# Patient Record
Sex: Female | Born: 1960 | Race: Black or African American
Health system: Southern US, Community
[De-identification: ages and names within clinical notes are randomized; demographics above are authoritative.]

## PROBLEM LIST (undated history)

## (undated) DIAGNOSIS — M1990 Unspecified osteoarthritis, unspecified site: Secondary | ICD-10-CM

## (undated) DIAGNOSIS — K219 Gastro-esophageal reflux disease without esophagitis: Secondary | ICD-10-CM

## (undated) HISTORY — PX: FEMUR SURGERY: SHX943

## (undated) HISTORY — DX: Unspecified osteoarthritis, unspecified site: M19.90

## (undated) HISTORY — DX: Gastro-esophageal reflux disease without esophagitis: K21.9

---

## 1984-11-14 HISTORY — PX: TUBAL LIGATION: SHX77

## 2005-06-01 ENCOUNTER — Ambulatory Visit: Payer: Self-pay | Admitting: Family Medicine

## 2006-06-06 ENCOUNTER — Emergency Department: Payer: Self-pay | Admitting: Emergency Medicine

## 2006-08-17 ENCOUNTER — Ambulatory Visit: Payer: Self-pay | Admitting: Family Medicine

## 2007-01-17 ENCOUNTER — Other Ambulatory Visit: Payer: Self-pay

## 2007-01-17 ENCOUNTER — Inpatient Hospital Stay: Payer: Self-pay | Admitting: *Deleted

## 2007-05-16 ENCOUNTER — Ambulatory Visit: Payer: Self-pay | Admitting: Family Medicine

## 2007-10-05 ENCOUNTER — Ambulatory Visit: Payer: Self-pay | Admitting: Family Medicine

## 2007-10-17 ENCOUNTER — Ambulatory Visit: Payer: Self-pay | Admitting: Family Medicine

## 2008-06-12 ENCOUNTER — Ambulatory Visit: Payer: Self-pay | Admitting: Family Medicine

## 2009-03-05 ENCOUNTER — Ambulatory Visit: Payer: Self-pay | Admitting: Emergency Medicine

## 2009-06-17 ENCOUNTER — Ambulatory Visit: Payer: Self-pay | Admitting: Family Medicine

## 2009-09-16 ENCOUNTER — Emergency Department: Payer: Self-pay | Admitting: Emergency Medicine

## 2010-05-20 ENCOUNTER — Ambulatory Visit: Payer: Self-pay | Admitting: General Practice

## 2010-06-22 ENCOUNTER — Ambulatory Visit: Payer: Self-pay | Admitting: Family

## 2010-07-05 ENCOUNTER — Emergency Department: Payer: Self-pay | Admitting: Emergency Medicine

## 2011-06-28 ENCOUNTER — Ambulatory Visit: Payer: Self-pay | Admitting: Family Medicine

## 2012-01-29 ENCOUNTER — Emergency Department: Payer: Self-pay | Admitting: Emergency Medicine

## 2012-05-05 ENCOUNTER — Ambulatory Visit: Payer: Self-pay | Admitting: Rheumatology

## 2012-07-03 ENCOUNTER — Ambulatory Visit: Payer: Self-pay | Admitting: Family Medicine

## 2013-05-20 ENCOUNTER — Ambulatory Visit: Payer: Self-pay | Admitting: General Practice

## 2013-05-29 ENCOUNTER — Ambulatory Visit: Payer: Self-pay | Admitting: General Practice

## 2015-01-15 ENCOUNTER — Emergency Department: Payer: Self-pay | Admitting: Emergency Medicine

## 2015-01-27 ENCOUNTER — Emergency Department: Payer: Self-pay | Admitting: Emergency Medicine

## 2015-02-03 ENCOUNTER — Emergency Department: Payer: Self-pay | Admitting: Emergency Medicine

## 2015-03-12 DIAGNOSIS — M4316 Spondylolisthesis, lumbar region: Secondary | ICD-10-CM | POA: Insufficient documentation

## 2015-03-12 DIAGNOSIS — M1611 Unilateral primary osteoarthritis, right hip: Secondary | ICD-10-CM | POA: Insufficient documentation

## 2015-03-12 DIAGNOSIS — M5416 Radiculopathy, lumbar region: Secondary | ICD-10-CM | POA: Insufficient documentation

## 2015-03-12 DIAGNOSIS — M4150 Other secondary scoliosis, site unspecified: Secondary | ICD-10-CM | POA: Insufficient documentation

## 2015-03-12 DIAGNOSIS — M47817 Spondylosis without myelopathy or radiculopathy, lumbosacral region: Secondary | ICD-10-CM | POA: Insufficient documentation

## 2015-06-05 ENCOUNTER — Other Ambulatory Visit: Payer: Self-pay | Admitting: Family Medicine

## 2015-06-05 ENCOUNTER — Ambulatory Visit
Admission: RE | Admit: 2015-06-05 | Discharge: 2015-06-05 | Disposition: A | Discharge status: Home / Self Care | Payer: No Typology Code available for payment source | Source: Ambulatory Visit | Attending: Family Medicine | Admitting: Family Medicine

## 2015-06-05 DIAGNOSIS — L02414 Cutaneous abscess of left upper limb: Secondary | ICD-10-CM

## 2016-05-25 ENCOUNTER — Ambulatory Visit: Payer: Self-pay | Attending: Internal Medicine | Admitting: *Deleted

## 2016-05-25 ENCOUNTER — Encounter: Payer: Self-pay | Admitting: *Deleted

## 2016-05-25 ENCOUNTER — Ambulatory Visit
Admission: RE | Admit: 2016-05-25 | Discharge: 2016-05-25 | Disposition: A | Discharge status: Home / Self Care | Payer: Self-pay | Source: Ambulatory Visit | Attending: Oncology | Admitting: Oncology

## 2016-05-25 VITALS — BP 120/77 | HR 73 | Temp 98.4°F | Resp 20 | Ht 66.14 in | Wt 99.2 lb

## 2016-05-25 DIAGNOSIS — Z0000 Encounter for general adult medical examination without abnormal findings: Secondary | ICD-10-CM

## 2016-05-25 NOTE — Progress Notes (Signed)
Subjective:     Patient ID: Cassidy Erickson, female   DOB: 14-Sep-1961, 55 y.o.   MRN: [id number]  HPI   Review of Systems     Objective:   Physical Exam  Pulmonary/Chest: Right breast exhibits no inverted nipple, no mass, no nipple discharge, no skin change and no tenderness. Left breast exhibits no inverted nipple, no mass, no nipple discharge, no skin change and no tenderness. Breasts are symmetrical.  The patient is so thin there is no palpable breast tissue, nipple areola complex only is palpable.    Abdominal: There is no splenomegaly or hepatomegaly.  Genitourinary: No labial fusion. There is no rash, tenderness, lesion or injury on the right labia. There is no rash, tenderness, lesion or injury on the left labia. Uterus is not deviated, not enlarged and not fixed. Cervix exhibits no motion tenderness, no discharge and no friability. No erythema, tenderness or bleeding in the vagina. No foreign body around the vagina. No signs of injury around the vagina. No vaginal discharge found.  Patient refused rectal exam       Assessment:     55 year old Black female presents to [facility] for clinical breast exam, pap and mammogram.  Clinical breast exam with palpable skin over the ribs, and nipple areola complex palpable.  Taught self breast awareness.  Specimen collected for pap smear.  Patient refused rectal exam.  Patient has been screened for eligibility.  She does not have any insurance, Medicare or Medicaid.  She also meets financial eligibility.  Hand-out given on the Affordable Care Act.      Plan:     Screening mammogram ordered.  Specimen sent to the lab.  Will follow-up per [facility] protocol.

## 2016-05-25 NOTE — Patient Instructions (Signed)
Human Papillomavirus Human papillomavirus (HPV) is the most common sexually transmitted infection (STI) and is highly contagious. HPV infections cause genital warts and cancers to the outlet of the womb (cervix), birth canal (vagina), opening of the birth canal (vulva), and anus. There are over 100 types of HPV. Unless wartlike lesions are present in the throat or there are genital warts that you can see or feel, HPV usually does not cause symptoms. It is possible to be infected for long periods and pass it on to others without knowing it. CAUSES  HPV is spread from person to person through sexual contact. This includes oral, vaginal, or anal sex. RISK FACTORS  Having unprotected sex. HPV can be spread by oral, vaginal, or anal sex.  Having several sex partners.  Having a sex partner who has other sex partners.  Having or having had another sexually transmitted infection. SIGNS AND SYMPTOMS  Most people carrying HPV do not have any symptoms. If symptoms are present, symptoms may include:  Wartlike lesions in the throat (from having oral sex).  Warts in the infected skin or mucous membranes.  Genital warts that may itch, burn, or bleed.  Genital warts that may be painful or bleed during sexual intercourse. DIAGNOSIS  If wartlike lesions are present in the throat or genital warts are present, your health care provider can usually diagnose HPV by physical examination.   Genital warts are easily seen with the naked eye.  Currently, there is no FDA-approved test to detect HPV in males.  In females, a Pap test can show cells that are infected with HPV.  In females, a scope can be used to view the cervix (colposcopy). A colposcopy can be performed if the pelvic exam or Pap test is abnormal. A sample of tissue may be removed (biopsy) during the colposcopy. TREATMENT  There is no treatment for the virus itself. However, there are treatments for the health problems and symptoms HPV can cause.  Your health care provider will follow you closely after you are treated. This is because the HPV can come back and may need treatment again. Treatment of HPV may include:   Medicines, which may be injected or applied in a cream, lotion, or gel form.  Use of a probe to apply extreme cold (cryotherapy).  Application of an intense beam of light (laser treatment).  Use of a probe to apply extreme heat (electrocautery).  Surgery. HOME CARE INSTRUCTIONS   Take medicines only as directed by your health care provider.  Use over-the-counter creams for itching or irritation as directed by your health care provider.  Keep all follow-up visits as directed by your health care provider. This is important.  Do not touch or scratch the warts.  Do not treat genital warts with medicines used for treating hand warts.  Do not have sex while you are being treated.  Do not douche or use tampons during treatment of HPV.  Tell your sex partner about your infection because he or she may also need treatment.  If you become pregnant, tell your health care provider that you have had HPV. Your health care provider will monitor you closely during pregnancy to be sure your baby is safe.  After treatment, use condoms during sex to prevent future infections.  Have only one sex partner.  Have a sex partner who does not have other sex partners. PREVENTION   Talk to your health care provider about getting the HPV vaccines. These vaccines prevent some HPV infections and cancers.  It is recommended that the vaccine be given to males and females between the ages of 87 and 42 years old. It will not work if you already have HPV, and it is not recommended for pregnant women.  A Pap test is done to screen for cervical cancer in women.  The first Pap test should be done at age 57 years.  Between ages 27 and 39 years, Pap tests are repeated every 2 years.  Beginning at age 53, you are advised to have a Pap test every  3 years as long as your past 3 Pap tests have been normal.  Some women have medical problems that increase the chance of getting cervical cancer. Talk to your health care provider about these problems. It is especially important to talk to your health care provider if a new problem develops soon after your last Pap test. In these cases, your health care provider may recommend more frequent screening and Pap tests.  The above recommendations are the same for women who have or have not gotten the vaccine for HPV.  If you had a hysterectomy for a problem that was not a cancer or a condition that could lead to cancer, then you no longer need Pap tests. However, even if you no longer need a Pap test, a regular exam is a good idea to make sure no other problems are starting.   If you are between the ages of 56 and 19 years and you have had normal Pap tests going back 10 years, you no longer need Pap tests. However, even if you no longer need a Pap test, a regular exam is a good idea to make sure no other problems are starting.  If you have had past treatment for cervical cancer or a condition that could lead to cancer, you need Pap tests and screening for cancer for at least 20 years after your treatment.  If Pap tests have been discontinued, risk factors (such as a new sexual partner)need to be reassessed to determine if screening should be resumed.  Some women may need screenings more often if they are at high risk for cervical cancer. SEEK MEDICAL CARE IF:   The treated skin becomes red, swollen, or painful.  You have a fever.  You feel generally ill.  You feel lumps or pimple-like projections in and around your genital area.  You develop bleeding of the vagina or the treatment area.  You have painful sexual intercourse. MAKE SURE YOU:   Understand these instructions.  Will watch your condition.  Will get help if you are not doing well or get worse.   This information is not  intended to replace advice given to you by your health care provider. Make sure you discuss any questions you have with your health care provider.   Document Released: 01/21/2004 Document Revised: 11/21/2014 Document Reviewed: 02/05/2014 Elsevier Interactive Patient Education 2016 [place] patient hand-out, Women Staying Healthy, Active and Well from [employer], with education on breast health, pap smears, heart and colon health.

## 2016-05-27 LAB — PAP LB AND HPV HIGH-RISK
HPV, high-risk: NEGATIVE
PAP Smear Comment: 0

## 2016-05-30 ENCOUNTER — Encounter: Payer: Self-pay | Admitting: *Deleted

## 2016-05-30 DIAGNOSIS — N63 Unspecified lump in unspecified breast: Secondary | ICD-10-CM

## 2016-05-30 NOTE — Progress Notes (Signed)
Letter mailed to inform patient of her normal mammogram and pap smear.  She is to follow-up in one year with annual mammo and in 5 years for her pap smear.  HSIS to [place].

## 2016-11-14 HISTORY — PX: FEMUR FRACTURE SURGERY: SHX633

## 2017-01-27 DIAGNOSIS — M533 Sacrococcygeal disorders, not elsewhere classified: Secondary | ICD-10-CM | POA: Insufficient documentation

## 2017-08-21 ENCOUNTER — Emergency Department: Payer: Self-pay

## 2017-08-21 ENCOUNTER — Emergency Department
Admission: EM | Admit: 2017-08-21 | Discharge: 2017-08-21 | Disposition: A | Discharge status: Home / Self Care | Payer: Self-pay | Attending: Emergency Medicine | Admitting: Emergency Medicine

## 2017-08-21 DIAGNOSIS — M1612 Unilateral primary osteoarthritis, left hip: Secondary | ICD-10-CM | POA: Insufficient documentation

## 2017-08-21 MED ORDER — KETOROLAC TROMETHAMINE 30 MG/ML IJ SOLN
30.0000 mg | Freq: Once | INTRAMUSCULAR | Status: AC
  Administered 2017-08-21: 30 mg via INTRAMUSCULAR
  Filled 2017-08-21: qty 1

## 2017-08-21 MED ORDER — PREDNISONE 20 MG PO TABS
60.0000 mg | ORAL_TABLET | Freq: Once | ORAL | Status: AC
  Administered 2017-08-21: 60 mg via ORAL
  Filled 2017-08-21: qty 3

## 2017-08-21 MED ORDER — METHYLPREDNISOLONE 4 MG PO TBPK: ORAL_TABLET | ORAL | 0 refills | Status: DC

## 2017-08-21 NOTE — ED Notes (Signed)
Pt discharged to home.  Family member driving.  Discharge instructions reviewed.  Verbalized understanding.  No questions or concerns at this time.  Teach back verified.  Pt in NAD.  No items left in ED.   

## 2017-08-21 NOTE — ED Triage Notes (Signed)
Pt states that she works on her feet 2 days a week, states that today she started having severe left hip pain, pt states that the pain is much worse with standing. Denies injury, reports hx of arthritis to the rt hip

## 2017-08-21 NOTE — Discharge Instructions (Signed)
Please take Medrol Dosepak as prescribed. After completion of Medrol Dosepak, you may resume naproxen. He is a walker as needed for ambulation. Call orthopedics tomorrow to schedule follow-up appointment. Return to the emergency department for any inability to walk, increasing pain, worsening symptoms or changes in health.

## 2017-08-21 NOTE — ED Provider Notes (Signed)
[facility] Provider Note   CSN: [id number] Arrival date & time: 08/21/17  1843     History   Chief Complaint Chief Complaint  Patient presents with  . Leg Pain    HPI Cassidy Erickson is a 56 y.o. female presents to the emergency department for evaluation of left leg pain. Patient states left leg pain began today around 2 PM. She denies any trauma or injury. She works as a Educational psychologist. Pain is only with weightbearing. Pain is located in the left groin, anterior thigh and left buttocks. She took ibuprofen around 2 PM today, proximally 600 mg with no improvement. She denies any lower back pain numbness tingling or radicular symptoms. She has a history of right hip osteoarthritis. She denies any pain or swelling or redness throughout the calf. No history of blood clots.   HPI  No past medical history on file.  There are no active problems to display for this patient.   No past surgical history on file.  OB History    No data available       Home Medications    Prior to Admission medications   Medication Sig Start Date End Date Taking? Authorizing Provider  methylPREDNISolone (MEDROL) 4 MG TBPK tablet Take packet as directed 08/21/17   Duanne Guess, PA-C    Family History No family history on file.  Social History Social History  Substance Use Topics  . Smoking status: Not on file  . Smokeless tobacco: Not on file  . Alcohol use Not on file     Allergies   Erythromycin   Review of Systems Review of Systems  Constitutional: Negative for fever.  Respiratory: Negative for shortness of breath.   Cardiovascular: Negative for chest pain.  Gastrointestinal: Negative for abdominal pain.  Genitourinary: Negative for difficulty urinating, dysuria and urgency.  Musculoskeletal: Positive for arthralgias and gait problem. Negative for back pain and myalgias.  Skin: Negative for rash.  Neurological: Negative for dizziness and headaches.     Physical  Exam Updated Vital Signs BP (!) 109/54 (BP Location: Right Arm)   Pulse 71   Temp 98.8 F (37.1 C) (Oral)   Resp 18   Ht 5\' 4"  (1.626 m)   Wt 49.9 kg (110 lb)   SpO2 99%   BMI 18.88 kg/m   Physical Exam  Constitutional: She is oriented to person, place, and time. She appears well-developed and well-nourished. No distress.  HENT:  Head: Normocephalic and atraumatic.  Mouth/Throat: Oropharynx is clear and moist.  Eyes: Conjunctivae are normal. Right eye exhibits no discharge. Left eye exhibits no discharge.  Neck: Normal range of motion.  Cardiovascular: Normal rate.   Pulmonary/Chest: No respiratory distress.  Musculoskeletal: Normal range of motion. She exhibits no deformity.  Examination of the lumbar spine shows no spinous process tenderness. Good range of motion lumbar spine with no discomfort. Examination of the left hip and lower extremity shows patient has severe pain with left hip internal rotation. She is tender along the trochanteric bursa. She has painful left hip flexion. Patient is able straight leg raise. She has no swelling warmth erythema throughout the left thigh and calf. Negative Homans sign. No swelling or edema throughout the left leg. She is to rest intact left lower extremity.  Neurological: She is alert and oriented to person, place, and time. She has normal reflexes.  Skin: Skin is warm and dry.  Psychiatric: She has a normal mood and affect. Her behavior is normal. Thought content normal.  ED Treatments / Results  Labs (all labs ordered are listed, but only abnormal results are displayed) Labs Reviewed - No data to display  EKG  EKG Interpretation None       Radiology Dg Hip Unilat W Or Wo Pelvis 2-3 Views Left  Result Date: 08/21/2017 CLINICAL DATA:  Severe left hip pain. EXAM: DG HIP (WITH OR WITHOUT PELVIS) 2-3V LEFT COMPARISON:  None. FINDINGS: There is no evidence of hip fracture or dislocation. There is buttressing of bilateral femoral  heads. Symmetric osteophytes off of the femoral neck are seen bilaterally. Mild joint space narrowing of bilateral hip joints. IMPRESSION: Moderate osteoarthritic changes of bilateral hips. Electronically Signed   By: Fidela Salisbury M.D.   On: 08/21/2017 21:30    Procedures Procedures (including critical care time)  Medications Ordered in ED Medications  ketorolac (TORADOL) 30 MG/ML injection 30 mg (not administered)  predniSONE (DELTASONE) tablet 60 mg (not administered)     Initial Impression / Assessment and Plan / ED Course  I have reviewed the triage vital signs and the nursing notes.  Pertinent labs & imaging results that were available during my care of the patient were reviewed by me and considered in my medical decision making (see chart for details).     56 year old female with significant left hip osteoarthritis. No evidence of acute bony abnormality. She is given Toradol 30 mg IM today in the emergency department. She is given a walker to help decrease the load off left hip. Prescription for 6 day Medrol Dosepak is given. She will follow up with orthopedist discussed treatment for left imposter arthritis. She is educated on signs and symptoms return to the ED for.  Final Clinical Impressions(s) / ED Diagnoses   Final diagnoses:  Primary osteoarthritis of left hip    New Prescriptions New Prescriptions   METHYLPREDNISOLONE (MEDROL) 4 MG TBPK TABLET    Take packet as directed     Renata Caprice 08/21/17 2235    Nance Pear, MD 08/21/17 2252

## 2017-08-31 DIAGNOSIS — F17200 Nicotine dependence, unspecified, uncomplicated: Secondary | ICD-10-CM | POA: Insufficient documentation

## 2018-02-19 ENCOUNTER — Other Ambulatory Visit: Payer: Self-pay | Admitting: Family Medicine

## 2018-02-19 DIAGNOSIS — Z13820 Encounter for screening for osteoporosis: Secondary | ICD-10-CM

## 2018-02-22 ENCOUNTER — Other Ambulatory Visit: Payer: Self-pay | Admitting: Family Medicine

## 2018-02-22 DIAGNOSIS — Z1231 Encounter for screening mammogram for malignant neoplasm of breast: Secondary | ICD-10-CM

## 2018-03-26 ENCOUNTER — Other Ambulatory Visit: Payer: Medicaid Other

## 2018-04-02 ENCOUNTER — Ambulatory Visit: Payer: Self-pay | Admitting: Licensed Clinical Social Worker

## 2018-05-02 ENCOUNTER — Other Ambulatory Visit: Payer: Medicaid Other

## 2018-05-14 ENCOUNTER — Ambulatory Visit: Payer: Self-pay | Admitting: Licensed Clinical Social Worker

## 2018-06-13 ENCOUNTER — Ambulatory Visit: Payer: Self-pay | Admitting: Licensed Clinical Social Worker

## 2018-06-13 ENCOUNTER — Encounter

## 2018-08-25 ENCOUNTER — Emergency Department
Admission: EM | Admit: 2018-08-25 | Discharge: 2018-08-25 | Disposition: A | Discharge status: Home / Self Care | Payer: BLUE CROSS/BLUE SHIELD | Attending: Emergency Medicine | Admitting: Emergency Medicine

## 2018-08-25 ENCOUNTER — Encounter: Payer: Self-pay | Admitting: Emergency Medicine

## 2018-08-25 ENCOUNTER — Other Ambulatory Visit: Payer: Self-pay

## 2018-08-25 DIAGNOSIS — F17210 Nicotine dependence, cigarettes, uncomplicated: Secondary | ICD-10-CM | POA: Insufficient documentation

## 2018-08-25 DIAGNOSIS — L234 Allergic contact dermatitis due to dyes: Secondary | ICD-10-CM | POA: Insufficient documentation

## 2018-08-25 MED ORDER — DEXAMETHASONE SODIUM PHOSPHATE 10 MG/ML IJ SOLN
10.0000 mg | Freq: Once | INTRAMUSCULAR | Status: AC
  Administered 2018-08-25: 10 mg via INTRAMUSCULAR
  Filled 2018-08-25: qty 1

## 2018-08-25 MED ORDER — PREDNISONE 10 MG PO TABS: ORAL_TABLET | ORAL | 0 refills | Status: DC

## 2018-08-25 NOTE — ED Triage Notes (Signed)
Itching and rash to scalp and face since using hair dye x 2 days ago. No resp distress.

## 2018-08-25 NOTE — Discharge Instructions (Addendum)
Follow-up with your primary care provider if any continued problems.  Begin taking prednisone 6 tablets today and taper down as directed on the bottle.  Also take Benadryl over-the-counter 1 or 2 tablets every 6 hours as needed for itching.  Return to the emergency department if any severe worsening of your symptoms.

## 2018-08-25 NOTE — ED Provider Notes (Signed)
[facility] Emergency Department Provider Note   ____________________________________________   First MD Initiated Contact with Patient 08/25/18 1052     (approximate)  I have reviewed the triage vital signs and the nursing notes.   HISTORY  Chief Complaint Rash   HPI Cassidy Erickson is a 57 y.o. female presents to the ED with complaint of severe itching of her scalp.  Patient states that she used hair dye and home on Thursday evening.  She states that yesterday she began itching and is continued today.  She has not taken any over-the-counter medication and is tried not scratching at it.  She states that she had a similar episode approximately 4 years ago.  She denies any difficulty breathing or swallowing.  Rash is confined just to her scalp.  History reviewed. No pertinent past medical history.  There are no active problems to display for this patient.   History reviewed. No pertinent surgical history.  Prior to Admission medications   Medication Sig Start Date End Date Taking? Authorizing Provider  predniSONE (DELTASONE) 10 MG tablet Take 6 tablets  today, on day 2 take 5 tablets, day 3 take 4 tablets, day 4 take 3 tablets, day 5 take  2 tablets and 1 tablet the last day 08/25/18   Johnn Hai, PA-C    Allergies Erythromycin  No family history on file.  Social History Social History   Tobacco Use  . Smoking status: Current Every Day Smoker    Packs/day: 0.50    Types: Cigarettes  Substance Use Topics  . Alcohol use: Not on file  . Drug use: Not on file    Review of Systems Constitutional: No fever/chills Eyes: No visual changes. ENT: No sore throat. Cardiovascular: Denies chest pain. Respiratory: Denies shortness of breath.  Gastrointestinal:   No nausea, no vomiting.  Musculoskeletal: Needed for muscle aches or joint pain. Skin: Positive for rash scalp. Neurological: Negative for headaches, focal weakness or  numbness. ____________________________________________   PHYSICAL EXAM:  VITAL SIGNS: ED Triage Vitals [08/25/18 1040]  Enc Vitals Group     BP 122/73     Pulse Rate 78     Resp 18     Temp 98.2 F (36.8 C)     Temp Source Oral     SpO2 99 %     Weight 100 lb (45.4 kg)     Height 5\' 5"  (1.651 m)     Head Circumference      Peak Flow      Pain Score 8     Pain Loc      Pain Edu?      Excl. in [place]?    Constitutional: Alert and oriented. Well appearing and in no acute distress.  Currently scratching scalp. Eyes: Conjunctivae are normal.  Head: Atraumatic. Nose: No congestion/rhinnorhea. Mouth/Throat: Mucous membranes are moist.  Oropharynx non-erythematous.  No edema. Neck: No stridor.   Cardiovascular: Normal rate, regular rhythm. Grossly normal heart sounds.  Good peripheral circulation. Respiratory: Normal respiratory effort.  No retractions. Lungs CTAB. Musculoskeletal: Moves upper and lower extremities that any difficulty. Neurologic:  Normal speech and language. No gross focal neurologic deficits are appreciated. No gait instability. Skin:  Skin is warm, dry and intact.  Erythema with mild excoriations are noted to the posterior scalp.  No involvement of the face.  Areas stop at the hairline.  Consistent with allergic reaction to the hair dye. Psychiatric: Mood and affect are normal. Speech and behavior are  normal.  ____________________________________________   LABS (all labs ordered are listed, but only abnormal results are displayed)  Labs Reviewed - No data to display  PROCEDURES  Procedure(s) performed: None  Procedures  Critical Care performed: No  ____________________________________________   INITIAL IMPRESSION / ASSESSMENT AND PLAN / ED COURSE  As part of my medical decision making, I reviewed the following data within the electronic MEDICAL RECORD NUMBER Notes from prior ED visits and [place] Controlled Substance Database  Presents to the ED with  complaint of allergic reaction to hair dye that she applied 2 days ago.  Patient not taking any over-the-counter medication for her complaint.  Patient was given Decadron 10 mg IM and is to continue with a prednisone Dosepak.  She is encouraged to take Benadryl 1 or 2 capsules every 6 hours as needed for itching.  We discussed refraining from her diet at this time until her skin has been rash free. ____________________________________________   FINAL CLINICAL IMPRESSION(S) / ED DIAGNOSES  Final diagnoses:  Allergic contact dermatitis due to dyes     ED Discharge Orders         Ordered    predniSONE (DELTASONE) 10 MG tablet     08/25/18 1121           Note:  This document was prepared using Dragon voice recognition software and may include unintentional dictation errors.    Johnn Hai, PA-C 08/25/18 1146    Nance Pear, MD 08/25/18 [phone]

## 2019-02-13 ENCOUNTER — Other Ambulatory Visit: Payer: Self-pay | Admitting: Family Medicine

## 2019-02-13 DIAGNOSIS — Z1231 Encounter for screening mammogram for malignant neoplasm of breast: Secondary | ICD-10-CM

## 2019-04-26 ENCOUNTER — Ambulatory Visit: Payer: Medicaid Other

## 2019-05-02 ENCOUNTER — Ambulatory Visit
Admission: RE | Admit: 2019-05-02 | Discharge: 2019-05-02 | Disposition: A | Discharge status: Home / Self Care | Payer: BLUE CROSS/BLUE SHIELD | Source: Ambulatory Visit | Attending: Family Medicine | Admitting: Family Medicine

## 2019-05-02 ENCOUNTER — Other Ambulatory Visit: Payer: Self-pay

## 2019-05-02 DIAGNOSIS — Z1231 Encounter for screening mammogram for malignant neoplasm of breast: Secondary | ICD-10-CM | POA: Insufficient documentation

## 2019-05-31 ENCOUNTER — Ambulatory Visit
Admission: RE | Admit: 2019-05-31 | Discharge: 2019-05-31 | Disposition: A | Discharge status: Home / Self Care | Payer: Disability Insurance | Source: Ambulatory Visit | Attending: Pediatrics | Admitting: Pediatrics

## 2019-05-31 ENCOUNTER — Other Ambulatory Visit: Payer: Self-pay | Admitting: Pediatrics

## 2019-05-31 ENCOUNTER — Ambulatory Visit
Admission: RE | Admit: 2019-05-31 | Discharge: 2019-05-31 | Disposition: A | Discharge status: Home / Self Care | Payer: Disability Insurance | Attending: Pediatrics | Admitting: Pediatrics

## 2019-05-31 DIAGNOSIS — M1990 Unspecified osteoarthritis, unspecified site: Secondary | ICD-10-CM | POA: Diagnosis not present

## 2019-05-31 DIAGNOSIS — M5136 Other intervertebral disc degeneration, lumbar region: Secondary | ICD-10-CM | POA: Diagnosis present

## 2019-06-25 ENCOUNTER — Encounter: Payer: Self-pay | Admitting: Family Medicine

## 2019-06-25 ENCOUNTER — Other Ambulatory Visit: Payer: Self-pay

## 2019-06-25 ENCOUNTER — Telehealth: Payer: Self-pay

## 2019-06-25 DIAGNOSIS — Z800 Family history of malignant neoplasm of digestive organs: Secondary | ICD-10-CM

## 2019-06-25 DIAGNOSIS — Z1211 Encounter for screening for malignant neoplasm of colon: Secondary | ICD-10-CM

## 2019-06-25 NOTE — Telephone Encounter (Signed)
Gastroenterology Pre-Procedure Review  Request Date: 07/03/19 Requesting Physician: Dr. Vicente Males  PATIENT REVIEW QUESTIONS: The patient responded to the following health history questions as indicated:    1. Are you having any GI issues? no 2. Do you have a personal history of Polyps? no 3. Do you have a family history of Colon Cancer or Polyps? yes (sister colon cancer) 4. Diabetes Mellitus? no 5. Joint replacements in the past 12 months?no 6. Major health problems in the past 3 months?no 7. Any artificial heart valves, MVP, or defibrillator?no    MEDICATIONS & ALLERGIES:    Patient reports the following regarding taking any anticoagulation/antiplatelet therapy:   Plavix, Coumadin, Eliquis, Xarelto, Lovenox, Pradaxa, Brilinta, or Effient? no Aspirin? no  Patient confirms/reports the following medications:  Current Outpatient Medications  Medication Sig Dispense Refill  . predniSONE (DELTASONE) 10 MG tablet Take 6 tablets  today, on day 2 take 5 tablets, day 3 take 4 tablets, day 4 take 3 tablets, day 5 take  2 tablets and 1 tablet the last day 21 tablet 0   No current facility-administered medications for this visit.     Patient confirms/reports the following allergies:  Allergies  Allergen Reactions  . Erythromycin Nausea And Vomiting    No orders of the defined types were placed in this encounter.   AUTHORIZATION INFORMATION Primary Insurance: 1D#: Group #:  Secondary Insurance: 1D#: Group #:  SCHEDULE INFORMATION: Date: 07/03/19 Time: Location:[facility]

## 2019-06-28 ENCOUNTER — Other Ambulatory Visit: Payer: Self-pay

## 2019-06-28 ENCOUNTER — Other Ambulatory Visit
Admission: RE | Admit: 2019-06-28 | Discharge: 2019-06-28 | Disposition: A | Discharge status: Home / Self Care | Payer: BLUE CROSS/BLUE SHIELD | Source: Ambulatory Visit | Attending: Gastroenterology | Admitting: Gastroenterology

## 2019-06-28 DIAGNOSIS — Z01812 Encounter for preprocedural laboratory examination: Secondary | ICD-10-CM | POA: Insufficient documentation

## 2019-06-28 DIAGNOSIS — Z20828 Contact with and (suspected) exposure to other viral communicable diseases: Secondary | ICD-10-CM | POA: Diagnosis not present

## 2019-06-28 LAB — SARS CORONAVIRUS 2 (TAT 6-24 HRS): SARS Coronavirus 2: NEGATIVE

## 2019-07-01 ENCOUNTER — Other Ambulatory Visit: Payer: Self-pay

## 2019-07-01 MED ORDER — NA SULFATE-K SULFATE-MG SULF 17.5-3.13-1.6 GM/177ML PO SOLN: 1.0000 | Freq: Once | ORAL | 0 refills | Status: AC

## 2019-07-03 ENCOUNTER — Ambulatory Visit: Payer: BLUE CROSS/BLUE SHIELD | Admitting: Anesthesiology

## 2019-07-03 ENCOUNTER — Encounter: Payer: Self-pay | Admitting: *Deleted

## 2019-07-03 ENCOUNTER — Other Ambulatory Visit: Payer: Self-pay

## 2019-07-03 ENCOUNTER — Ambulatory Visit
Admission: RE | Admit: 2019-07-03 | Discharge: 2019-07-03 | Disposition: A | Discharge status: Home / Self Care | Payer: BLUE CROSS/BLUE SHIELD | Source: Ambulatory Visit | Attending: Gastroenterology | Admitting: Gastroenterology

## 2019-07-03 ENCOUNTER — Encounter
Admission: RE | Disposition: A | Discharge status: Home / Self Care | Payer: Self-pay | Source: Ambulatory Visit | Attending: Gastroenterology

## 2019-07-03 DIAGNOSIS — D124 Benign neoplasm of descending colon: Secondary | ICD-10-CM | POA: Diagnosis not present

## 2019-07-03 DIAGNOSIS — Z860100 Personal history of colon polyps, unspecified: Secondary | ICD-10-CM

## 2019-07-03 DIAGNOSIS — Z800 Family history of malignant neoplasm of digestive organs: Secondary | ICD-10-CM | POA: Insufficient documentation

## 2019-07-03 DIAGNOSIS — F17210 Nicotine dependence, cigarettes, uncomplicated: Secondary | ICD-10-CM | POA: Diagnosis not present

## 2019-07-03 DIAGNOSIS — Z881 Allergy status to other antibiotic agents status: Secondary | ICD-10-CM | POA: Insufficient documentation

## 2019-07-03 DIAGNOSIS — Z1211 Encounter for screening for malignant neoplasm of colon: Secondary | ICD-10-CM | POA: Insufficient documentation

## 2019-07-03 DIAGNOSIS — K635 Polyp of colon: Secondary | ICD-10-CM | POA: Insufficient documentation

## 2019-07-03 HISTORY — PX: COLONOSCOPY WITH PROPOFOL: SHX5780

## 2019-07-03 SURGERY — COLONOSCOPY WITH PROPOFOL
Anesthesia: General

## 2019-07-03 MED ORDER — PROPOFOL 10 MG/ML IV BOLUS
INTRAVENOUS | Status: DC | PRN
  Administered 2019-07-03: 80 mg via INTRAVENOUS

## 2019-07-03 MED ORDER — PROPOFOL 10 MG/ML IV BOLUS
INTRAVENOUS | Status: AC
  Filled 2019-07-03: qty 20

## 2019-07-03 MED ORDER — PROPOFOL 500 MG/50ML IV EMUL
INTRAVENOUS | Status: DC | PRN
  Administered 2019-07-03: 130 ug/kg/min via INTRAVENOUS

## 2019-07-03 MED ORDER — SODIUM CHLORIDE 0.9 % IV SOLN
INTRAVENOUS | Status: DC
  Administered 2019-07-03: 14:00:00 via INTRAVENOUS

## 2019-07-03 NOTE — Transfer of Care (Signed)
Immediate Anesthesia Transfer of Care Note  Patient: Cassidy Erickson  Procedure(s) Performed: COLONOSCOPY WITH PROPOFOL (N/A )  Patient Location: Endoscopy Unit  Anesthesia Type:General  Level of Consciousness: drowsy and patient cooperative  Airway & Oxygen Therapy: Patient Spontanous Breathing  Post-op Assessment: Report given to RN and Post -op Vital signs reviewed and stable  Post vital signs: Reviewed and stable  Last Vitals:  Vitals Value Taken Time  BP 126/98 07/03/19 1443  Temp 36.6 C 07/03/19 1441  Pulse 61 07/03/19 1451  Resp 19 07/03/19 1451  SpO2 100 % 07/03/19 1451  Vitals shown include unvalidated device data.  Last Pain:  Vitals:   07/03/19 1441  TempSrc: Tympanic  PainSc: 0-No pain         Complications: No apparent anesthesia complications

## 2019-07-03 NOTE — Anesthesia Preprocedure Evaluation (Signed)
Anesthesia Evaluation  Patient identified by MRN, date of birth, ID band Patient awake    Reviewed: Allergy & Precautions, NPO status , Patient's Chart, lab work & pertinent test results  History of Anesthesia Complications Negative for: history of anesthetic complications  Airway Mallampati: II  TM Distance: >3 FB Neck ROM: Full    Dental no notable dental hx.    Pulmonary neg sleep apnea, neg COPD, Current SmokerPatient did not abstain from smoking.,    breath sounds clear to auscultation- rhonchi (-) wheezing      Cardiovascular Exercise Tolerance: Good (-) hypertension(-) CAD and (-) Past MI  Rhythm:Regular Rate:Normal - Systolic murmurs and - Diastolic murmurs    Neuro/Psych negative neurological ROS  negative psych ROS   GI/Hepatic negative GI ROS, Neg liver ROS,   Endo/Other  negative endocrine ROSneg diabetes  Renal/GU negative Renal ROS     Musculoskeletal negative musculoskeletal ROS (+)   Abdominal (+) - obese,   Peds  Hematology negative hematology ROS (+)   Anesthesia Other Findings    Reproductive/Obstetrics                             Anesthesia Physical Anesthesia Plan  ASA: II  Anesthesia Plan: General   Post-op Pain Management:    Induction: Intravenous  PONV Risk Score and Plan: 1 and Propofol infusion  Airway Management Planned: Natural Airway  Additional Equipment:   Intra-op Plan:   Post-operative Plan:   Informed Consent: I have reviewed the patients History and Physical, chart, labs and discussed the procedure including the risks, benefits and alternatives for the proposed anesthesia with the patient or authorized representative who has indicated his/her understanding and acceptance.     Dental advisory given  Plan Discussed with: CRNA and Anesthesiologist  Anesthesia Plan Comments:         Anesthesia Quick Evaluation

## 2019-07-03 NOTE — Op Note (Signed)
[facility] Patient Name: Cassidy Erickson Procedure Date: 07/03/2019 2:05 PM MRN: [id number] Account #: [id number] Date of Birth: 1961-09-26 Admit Type: Outpatient Age: 58 Room: [facility] ENDO ROOM 3 Gender: Female Note Status: Finalized Procedure:            Colonoscopy Indications:          High risk colon cancer surveillance: Personal history                        of colonic polyps Providers:            Jonathon Bellows MD, MD Referring MD:         Tania Ade (Referring MD) Medicines:            Monitored Anesthesia Care Complications:        No immediate complications. Procedure:            Pre-Anesthesia Assessment:                       - Prior to the procedure, a History and Physical was                        performed, and patient medications, allergies and                        sensitivities were reviewed. The patient's tolerance of                        previous anesthesia was reviewed.                       - The risks and benefits of the procedure and the                        sedation options and risks were discussed with the                        patient. All questions were answered and informed                        consent was obtained.                       - ASA Grade Assessment: II - A patient with mild                        systemic disease.                       After obtaining informed consent, the colonoscope was                        passed under direct vision. Throughout the procedure,                        the patient's blood pressure, pulse, and oxygen                        saturations were monitored continuously. The was  introduced through the anus and advanced to the the                        cecum, identified by the appendiceal orifice, IC valve                        and transillumination. The colonoscopy was performed                        with ease. The patient tolerated the procedure  well.                        The quality of the bowel preparation was excellent. Findings:      The perianal and digital rectal examinations were normal.      A 10 mm polyp was found in the descending colon. The polyp was       pedunculated. The polyp was removed with a hot snare. Resection and       retrieval were complete.      A 8 mm polyp was found in the sigmoid colon. The polyp was       semi-pedunculated. The polyp was removed with a hot snare. Resection and       retrieval were complete.      A 5 mm polyp was found in the sigmoid colon. The polyp was sessile. The       polyp was removed with a cold snare. Resection and retrieval were       complete.      The exam was otherwise without abnormality on direct and retroflexion       views. Impression:           - One 10 mm polyp in the descending colon, removed with                        a hot snare. Resected and retrieved.                       - One 8 mm polyp in the sigmoid colon, removed with a                        hot snare. Resected and retrieved.                       - One 5 mm polyp in the sigmoid colon, removed with a                        cold snare. Resected and retrieved.                       - The examination was otherwise normal on direct and                        retroflexion views. Recommendation:       - Discharge patient to home (with escort).                       - Resume previous diet.                       - Continue present medications.                       -  Await pathology results.                       - Repeat colonoscopy in 3 years for surveillance. Procedure Code(s):    --- Professional ---                       [phone], Colonoscopy, flexible; with removal of tumor(s),                        polyp(s), or other lesion(s) by snare technique Diagnosis Code(s):    --- Professional ---                       Z86.010, Personal history of colonic polyps                       K63.5, Polyp of colon CPT  copyright 2019 American Medical Association. All rights reserved. The codes documented in this report are preliminary and upon coder review may  be revised to meet current compliance requirements. Jonathon Bellows, MD Jonathon Bellows MD, MD 07/03/2019 2:40:47 PM This report has been signed electronically. Number of Addenda: 0 Note Initiated On: 07/03/2019 2:05 PM Scope Withdrawal Time: 0 hours 17 minutes 46 seconds  Total Procedure Duration: 0 hours 24 minutes 15 seconds  Estimated Blood Loss: Estimated blood loss: none.      [facility]

## 2019-07-03 NOTE — Anesthesia Post-op Follow-up Note (Signed)
Anesthesia QCDR form completed.        

## 2019-07-03 NOTE — Anesthesia Postprocedure Evaluation (Signed)
Anesthesia Post Note  Patient: Cassidy Erickson  Procedure(s) Performed: COLONOSCOPY WITH PROPOFOL (N/A )  Patient location during evaluation: Endoscopy Anesthesia Type: General Level of consciousness: awake and alert and oriented Pain management: pain level controlled Vital Signs Assessment: post-procedure vital signs reviewed and stable Respiratory status: spontaneous breathing, nonlabored ventilation and respiratory function stable Cardiovascular status: blood pressure returned to baseline and stable Postop Assessment: no signs of nausea or vomiting Anesthetic complications: no     Last Vitals:  Vitals:   07/03/19 1501 07/03/19 1511  BP: 133/73 134/89  Pulse: 60 (!) 58  Resp: 17 16  Temp:    SpO2: 100% 100%    Last Pain:  Vitals:   07/03/19 1511  TempSrc:   PainSc: 0-No pain                 Burma Ketcher

## 2019-07-03 NOTE — H&P (Signed)
Jonathon Bellows, MD [address] [place], [place], [place], [address] [address], [place], [place], [place], [address] Phone: [phone]  Fax: [phone]  Primary Care Physician:  Herminio Commons, MD   Pre-Procedure History & Physical: HPI:  Cassidy Erickson is a 58 y.o. female is here for an colonoscopy.   History reviewed. No pertinent past medical history.  Past Surgical History:  Procedure Laterality Date  . FEMUR FRACTURE SURGERY Left 2018  . TUBAL LIGATION  1986    Prior to Admission medications   Medication Sig Start Date End Date Taking? Authorizing Provider  predniSONE (DELTASONE) 10 MG tablet Take 6 tablets  today, on day 2 take 5 tablets, day 3 take 4 tablets, day 4 take 3 tablets, day 5 take  2 tablets and 1 tablet the last day 08/25/18   Johnn Hai, PA-C    Allergies as of 06/26/2019 - Review Complete 08/25/2018  Allergen Reaction Noted  . Erythromycin Nausea And Vomiting 08/21/2017    History reviewed. No pertinent family history.  Social History   Socioeconomic History  . Marital status: Single    Spouse name: Not on file  . Number of children: Not on file  . Years of education: Not on file  . Highest education level: Not on file  Occupational History  . Not on file  Social Needs  . Financial resource strain: Not on file  . Food insecurity    Worry: Not on file    Inability: Not on file  . Transportation needs    Medical: Not on file    Non-medical: Not on file  Tobacco Use  . Smoking status: Current Every Day Smoker    Packs/day: 0.50    Types: Cigarettes  . Smokeless tobacco: Never Used  Substance and Sexual Activity  . Alcohol use: Yes    Alcohol/week: 14.0 standard drinks    Types: 14 Cans of beer per week    Comment: 2 beers/day  . Drug use: Never  . Sexual activity: Not on file  Lifestyle  . Physical activity    Days per week: Not on file    Minutes per session: Not on file  . Stress: Not on file  Relationships   . Social Herbalist on phone: Not on file    Gets together: Not on file    Attends religious service: Not on file    Active member of club or organization: Not on file    Attends meetings of clubs or organizations: Not on file    Relationship status: Not on file  . Intimate partner violence    Fear of current or ex partner: Not on file    Emotionally abused: Not on file    Physically abused: Not on file    Forced sexual activity: Not on file  Other Topics Concern  . Not on file  Social History Narrative  . Not on file    Review of Systems: See HPI, otherwise negative ROS  Physical Exam: BP 139/80   Pulse 76   Temp 97.7 F (36.5 C) (Tympanic)   Resp 18   Ht 5\' 4"  (1.626 m)   Wt 44.5 kg   SpO2 100%   BMI 16.82 kg/m  General:   Alert,  pleasant and cooperative in NAD Head:  Normocephalic and atraumatic. Neck:  Supple; no masses or thyromegaly. Lungs:  Clear throughout to auscultation, normal respiratory effort.    Heart:  +S1, +S2, Regular rate  and rhythm, No edema. Abdomen:  Soft, nontender and nondistended. Normal bowel sounds, without guarding, and without rebound.   Neurologic:  Alert and  oriented x4;  grossly normal neurologically.  Impression/Plan: Cassidy Erickson is here for an colonoscopy to be performed for surveillance due to prior history of colon polyps   Risks, benefits, limitations, and alternatives regarding  colonoscopy have been reviewed with the patient.  Questions have been answered.  All parties agreeable.   Jonathon Bellows, MD  07/03/2019, 2:00 PM

## 2019-07-04 ENCOUNTER — Encounter: Payer: Self-pay | Admitting: Gastroenterology

## 2019-07-05 LAB — SURGICAL PATHOLOGY

## 2019-07-07 ENCOUNTER — Encounter: Payer: Self-pay | Admitting: Gastroenterology

## 2020-09-08 ENCOUNTER — Other Ambulatory Visit: Payer: Self-pay | Admitting: Family Medicine

## 2021-06-20 ENCOUNTER — Emergency Department
Admission: EM | Admit: 2021-06-20 | Discharge: 2021-06-20 | Disposition: A | Discharge status: Home / Self Care | Payer: BC Managed Care – PPO | Attending: Emergency Medicine | Admitting: Emergency Medicine

## 2021-06-20 DIAGNOSIS — L243 Irritant contact dermatitis due to cosmetics: Secondary | ICD-10-CM | POA: Insufficient documentation

## 2021-06-20 MED ORDER — OXYCODONE-ACETAMINOPHEN 5-325 MG PO TABS
1.0000 | ORAL_TABLET | ORAL | 0 refills | Status: AC | PRN
Start: 2021-06-20 — End: 2021-06-27

## 2021-06-20 MED ORDER — FAMOTIDINE 20 MG PO TABS
20.0000 mg | ORAL_TABLET | Freq: Two times a day (BID) | ORAL | 0 refills | Status: AC
Start: 2021-06-20 — End: 2021-06-25

## 2021-06-20 MED ORDER — DIPHENHYDRAMINE HCL 25 MG PO TABS
25.0000 mg | ORAL_TABLET | Freq: Four times a day (QID) | ORAL | 0 refills | Status: AC | PRN
Start: 2021-06-20 — End: ?

## 2021-06-20 MED ORDER — FAMOTIDINE 20 MG PO TABS
20.0000 mg | ORAL_TABLET | Freq: Once | ORAL | Status: AC
Start: 2021-06-20 — End: 2021-06-20
  Administered 2021-06-20: 09:00:00 20 mg via ORAL
  Filled 2021-06-20: qty 1

## 2021-06-20 MED ORDER — PREDNISONE 20 MG PO TABS
40.0000 mg | ORAL_TABLET | Freq: Every day | ORAL | 0 refills | Status: AC
Start: 2021-06-20 — End: 2021-06-25

## 2021-06-20 MED ORDER — CETIRIZINE HCL 10 MG PO TABS
10.0000 mg | ORAL_TABLET | Freq: Every day | ORAL | Status: DC
Start: 2021-06-20 — End: 2021-06-20
  Administered 2021-06-20: 09:00:00 10 mg via ORAL
  Filled 2021-06-20: qty 1

## 2021-06-20 MED ORDER — PREDNISONE 20 MG PO TABS
60.0000 mg | ORAL_TABLET | Freq: Once | ORAL | Status: AC
Start: 2021-06-20 — End: 2021-06-20
  Administered 2021-06-20: 09:00:00 60 mg via ORAL
  Filled 2021-06-20: qty 3

## 2021-06-20 NOTE — ED Provider Notes (Signed)
EMERGENCY DEPARTMENT NOTE     Patient initially seen and examined at   ED PHYSICIAN ASSIGNED       None           ED MIDLEVEL (APP) ASSIGNED       Date/Time Event User Comments    06/20/21 [phone] PA/NP Provider Assigned Laural Benes, Sophronia Simas, NP assigned as Nurse Practitioner            HISTORY OF PRESENT ILLNESS   Historian:Patient  Translator Used: No    Chief Complaint: Allergic Reaction     Mechanism of Injury:       60 y.o. female patient presents the ER states that she used a perm on her head approximately 3 days ago, ever since patient has been having itching pain, with small blisters to her forehead and scalp.  Patient has that she is been at home using hydrocortisone cream and Benadryl, but symptoms or not improving.    Location of symptoms: head and scalp  Onset of symptoms: 3 days ago  What was patient doing when symptoms started (Context): see above  Severity: moderate  Timing: ongoing  Activities that worsen symptoms: palpation, hair dye/products  Activities that improve symptoms: nothing  Quality: blisters, burning, itching  Radiation of symptoms: no  Associated signs and Symptoms: see above  Are symptoms worsening? yes  MEDICAL HISTORY     Past Medical History:  History reviewed. No pertinent past medical history.    Past Surgical History:  History reviewed. No pertinent surgical history.    Social History:  Social History     Socioeconomic History    Marital status: Single   Tobacco Use    Smoking status: Unknown       Family History:  History reviewed. No pertinent family history.    Outpatient Medication:  Discharge Medication List as of 06/20/2021  9:17 AM            REVIEW OF SYSTEMS   Review of Systems     10/14 Review of Systems completed and negative except as stated above in the HPI (Systems reviewed: HENT, Eyes, Resp, CV, GI, GU, MSK, Skin, Neuro, Psych)        PHYSICAL EXAM     ED Triage Vitals [06/20/21 0910]   Enc Vitals Group      BP 146/83      Heart Rate 87      Resp Rate 18       Temp 97.8 F (36.6 C)      Temp Source Oral      SpO2 99 %      Weight 61.2 kg      Height 1.651 m      Head Circumference       Peak Flow       Pain Score       Pain Loc       Pain Edu?       Excl. in GC?      Nursing note and vitals reviewed.  Constitutional:  Well developed, well nourished.  Awake & alert.    Head:  Atraumatic.  Normocephalic.  No temporal ttp.  Normal temporal artery pulsation  Eyes:  PERRL.  EOMI.  Conjunctivae are not pale. No discharge from eyes b/l.  ENT:  Mucous membranes are moist and intact.  Patent airway. No facial ttp.   Neck:  Supple.  Full ROM.  No meningeal signs.  Cardiovascular:  No JVD  Pulmonary/Chest:  No evidence of respiratory distress.    Extremities:  No edema.   No cyanosis.  No clubbing.    Skin:  Skin is warm and dry.  No diaphoresis. Pt has red irritated scalp, and small blisters that are beginning to scab over on scalp and forehead.   Neurological:  Alert, awake, and appropriate.  Normal speech.  Sensation normal b/l UE and LE. cranial nerves 2-10 intact. Motor is intact b/l UE and LE.  Normal gate.  Psychiatric:  Good eye contact.  Normal interaction, affect, and behavior       MEDICAL DECISION MAKING     DISCUSSION    Presentation consistent with contact dermatitis as patient was exposed to hair perm product.  No evidence of systemic symptoms of anaphylaxis, infections including cellulitis, or bullous disorders. Advised to monitor exposures and record symptoms.  Supportive therapies discussed.   Follow up with primary physician if symptoms continue. Monitor for high fever, spread of rash, pain, or other concerns.    Impression:    Contact Dermatitis        Plan:   Prescribed prednisone , benadryl, pepcid and instructed to use baby no tears shampoo.   Advised Pt to take an OTC antihistamine as directed prn.  Advised Pt on supportive measures, including taking cool showers, using Ivy Block or Aveeno or Calamine lotion, trimming nails and limiting excoriation,  identifying and avoiding allergen source, washing suspected clothes and materials that may be allergen carriers in hot water and detergent, bathe pets, and cover any oozing blisters as they may form.  Instructed patient to follow up with PCP if symptoms continue.  Present to ER with new or worsening symptoms.             All labs and vital signs from the current visit have been reviewed and any abnormality that is present is not due to severe sepsis or septic shock.    Vital Signs: Reviewed the patient's vital signs.   Nursing Notes: Reviewed and utilized available nursing notes.  Medical Records Reviewed: Reviewed available past medical records.  Counseling: The emergency provider has spoken with the patient and discussed today's findings, in addition to providing specific details for the plan of care.  Questions are answered and there is agreement with the plan.      MIPS DOCUMENTATION      CARDIAC STUDIES    The following cardiac studies were independently interpreted by the Emergency Medicine Physician.  For full cardiac study results please see chart.    Monitor Strip  Interpreted by ED NP  Rate: 87  Rhythm: NSR   ST Changes: none    EMERGENCY IMAGING STUDIES    The following imagine studies were independently interpreted by me (emergency physician):    RADIOLOGY IMAGING STUDIES      No orders to display     PULSE OXIMETRY    Oxygen Saturation by Pulse Oximetry: 99%  Interventions: none  Interpretation:  wnl    EMERGENCY DEPT. MEDICATIONS      ED Medication Orders (From admission, onward)      Start Ordered     Status Ordering Provider    06/20/21 0913 06/20/21 0912    Daily        Route: Oral  Ordered Dose: 10 mg     Discontinued Geralynn Rile    06/20/21 0913 06/20/21 0912  famotidine (PEPCID) tablet 20 mg  Once        Route: Oral  Ordered Dose:  20 mg     Last MAR action: Given Geralynn Rile    06/20/21 0913 06/20/21 0912  predniSONE (DELTASONE) tablet 60 mg  Once        Route: Oral  Ordered Dose: 60 mg      Last MAR action: Given Vetta Couzens S            LABORATORY RESULTS    Ordered and independently interpreted AVAILABLE laboratory tests. Please see results section in chart for full details.  No results found for this or any previous visit.    CRITICAL CARE/PROCEDURES    Procedures      DIAGNOSIS      Diagnosis:  Final diagnoses:   Irritant contact dermatitis due to cosmetics       Disposition:  ED Disposition       ED Disposition   Discharge    Condition   --    Date/Time   Sun Jun 20, 2021  9:15 AM    Comment   [name] discharge to home/self care.    Condition at disposition: Stable                 Prescriptions:  Discharge Medication List as of 06/20/2021  9:17 AM        START taking these medications    Details   diphenhydrAMINE (BENADRYL) 25 MG tablet Take 1 tablet (25 mg total) by mouth every 6 (six) hours as needed for Itching, Starting Sun 06/20/2021, E-Rx      famotidine (PEPCID) 20 MG tablet Take 1 tablet (20 mg total) by mouth 2 (two) times daily for 5 days, Starting Sun 06/20/2021, Until Fri 06/25/2021, E-Rx      oxyCODONE-acetaminophen (PERCOCET) 5-325 MG per tablet Take 1 tablet by mouth every 4 (four) hours as needed for Pain, Starting Sun 06/20/2021, Until Sun 06/27/2021 at 2359, E-Rx      predniSONE (DELTASONE) 20 MG tablet Take 2 tablets (40 mg total) by mouth daily for 5 days, Starting Sun 06/20/2021, Until Fri 06/25/2021, E-Rx                Geralynn Rile, NP  06/21/21 [phone]

## 2021-12-07 ENCOUNTER — Emergency Department
Admission: EM | Admit: 2021-12-07 | Discharge: 2021-12-07 | Disposition: A | Discharge status: Other Acute Inpatient Hospital | Payer: BC Managed Care – PPO | Attending: Emergency Medicine | Admitting: Emergency Medicine

## 2021-12-07 DIAGNOSIS — M79604 Pain in right leg: Secondary | ICD-10-CM | POA: Insufficient documentation

## 2021-12-07 DIAGNOSIS — Z538 Procedure and treatment not carried out for other reasons: Secondary | ICD-10-CM | POA: Insufficient documentation

## 2021-12-07 NOTE — ED Triage Notes (Signed)
[facility] EMERGENCY DEPARTMENT PIT NOTE        Patient Name: Andrea Mcintyre has had a rapid medical screening evaluation by myself for the chief complaint of right leg pain from hip to foot.  Pain worse with ambulation.  Denies falls, trauma.    Patient denies any pain radiation, fever, history of IV drug abuse, bladder, bowel dysfunction, numbness, tingling, weakness to lower extremities.  No relief with aleeve 1300.      Vitals: BP 133/82   Pulse 77   Temp 98.1 F (36.7 C) (Oral)   Resp 17   Ht 5\' 4"  (1.626 m)   Wt 69.9 kg   SpO2 99%   BMI 26.43 kg/m       Pertinent brief exam: ambulates with limp      Prelim orders: none    Patient advised to remain in the ED until further evaluation can be performed. Patient instructed to notify staff of any changes in condition while waiting.    I am not the sole provider and this assessment is only an initial evaluation prior to full evaluation to expedite care.

## 2021-12-07 NOTE — ED Triage Notes (Signed)
Andrea Mcintyre is a 61 y.o. female who presents to ED with c/o right leg pain from the hip down to her foot that began today. Reports pain with ambulation, worsening low back pain reported. Denies any falls or injury. No relief with OTC Aleve, last dose at 1300. Denies known fevers, no urinary complaints or abdominal pain.     BP 133/82   Pulse 77   Temp 98.1 F (36.7 C) (Oral)   Resp 17   Ht 5\' 4"  (1.626 m)   Wt 69.9 kg   SpO2 99%   BMI 26.43 kg/m

## 2022-01-15 ENCOUNTER — Emergency Department
Admission: EM | Admit: 2022-01-15 | Discharge: 2022-01-15 | Disposition: A | Discharge status: Home / Self Care | Payer: Medicare Other | Attending: Emergency Medicine | Admitting: Emergency Medicine

## 2022-01-15 DIAGNOSIS — M5432 Sciatica, left side: Secondary | ICD-10-CM | POA: Insufficient documentation

## 2022-01-15 HISTORY — DX: Unspecified osteoarthritis, unspecified site: M19.90

## 2022-01-15 MED ORDER — METHYLPREDNISOLONE 4 MG PO TBPK
ORAL_TABLET | ORAL | 0 refills | Status: AC
Start: 2022-01-15 — End: ?

## 2022-01-15 MED ORDER — OXYCODONE-ACETAMINOPHEN 5-325 MG PO TABS
1.0000 | ORAL_TABLET | ORAL | 0 refills | Status: AC | PRN
Start: 2022-01-15 — End: 2022-01-22

## 2022-01-15 MED ORDER — KETOROLAC TROMETHAMINE 30 MG/ML IJ SOLN
60.0000 mg | Freq: Once | INTRAMUSCULAR | Status: AC
Start: 2022-01-15 — End: 2022-01-15
  Administered 2022-01-15: 60 mg via INTRAMUSCULAR
  Filled 2022-01-15: qty 2

## 2022-01-15 MED ORDER — PREDNISONE 20 MG PO TABS
40.0000 mg | ORAL_TABLET | Freq: Once | ORAL | Status: AC
Start: 2022-01-15 — End: 2022-01-15
  Administered 2022-01-15: 40 mg via ORAL
  Filled 2022-01-15: qty 2

## 2022-01-15 NOTE — ED Provider Notes (Signed)
EMERGENCY DEPARTMENT NOTE     Patient initially seen and examined at   ED PHYSICIAN ASSIGNED       Date/Time Event User Comments    01/15/22 1055 Physician Assigned Leslee Home, MD assigned as Attending           ED MIDLEVEL (APP) ASSIGNED       None            HISTORY OF PRESENT ILLNESS   Translator Used : No    Chief Complaint: Back Pain       61 y.o. female with past medical history as below presents with left lower lumbar/buttock pain that radiates down her left leg to her knee. This started in the last week. It was initially on the right lower back. Denies any trauma or injury. Denies hx of dm, cancer, ivdu. Denies numbness, weakness, incontinence. Does not have midline back pain.     Independent Historian (other than patient): No  Additional History Provided by Independent Historian:  MEDICAL HISTORY     Past Medical History:  Past Medical History:   Diagnosis Date    Arthritis        Past Surgical History:  Past Surgical History:   Procedure Laterality Date    FEMUR SURGERY         Social History:  Social History     Socioeconomic History    Marital status: Single   Tobacco Use    Smoking status: Unknown   Vaping Use    Vaping Use: Never used   Substance and Sexual Activity    Alcohol use: Yes     Comment: Socially    Drug use: Never       Family History:  History reviewed. No pertinent family history.    Outpatient Medication:  Previous Medications    BUPROPION XL (WELLBUTRIN XL) 150 MG 24 HR TABLET    Take 150 mg by mouth daily    DIPHENHYDRAMINE (BENADRYL) 25 MG TABLET    Take 1 tablet (25 mg total) by mouth every 6 (six) hours as needed for Itching         REVIEW OF SYSTEMS   Review of Systems   Genitourinary:         No incontinence   Musculoskeletal:  Positive for back pain.   Neurological:  Negative for sensory change and focal weakness.   All other systems reviewed and are negative.    PHYSICAL EXAM     ED Triage Vitals [01/15/22 1046]   Enc Vitals Group      BP 134/80       Heart Rate 86      Resp Rate 18      Temp 98.2 F (36.8 C)      Temp Source Oral      SpO2 99 %      Weight 63.5 kg      Height 1.727 m      Head Circumference       Peak Flow       Pain Score 9      Pain Loc       Pain Edu?       Excl. in [place]?      Nursing note and vitals reviewed.    Constitutional: non-toxic  Head: Atraumatic.  Eyes: No scleral icterus.  ENT: Stable  Neck: Supple.   Pulmonary/Chest: No evidence of respiratory distress.   GI: Soft, non-distended abdomen.   Extremities: No  deformity.  Skin: No rash.   Neurological: Awake, alert and oriented x 3. CN II-XII intact. Strength/sensation normal in lower ext, 5/5 hips, knees, ankles, big toes, decreased reflexes knees  Psychiatric: Appropriate affect. Appropriate mood. Appropriate behavior.    MEDICAL DECISION MAKING     PRIMARY PROBLEM LIST      Acute illness/injury DIAGNOSIS:sciatica  Chronic Illness Impacting Care of the above problem: N/A N/A    DISCUSSION      Sciatica symptoms. No red flags. Normal neuro exam. Has tried nsaids, muscle relaxant. Giving a dose of toradol, prednisone here. D/c home medrol dose pack, percocet. Follow up with pcp or neuro spine in next week. Return precautions, plan discussed.    If patient is being hospitalized is severe sepsis or septic shock suspected?: N/A          External Records Reviewed?: [place] Prescription Monitoring Database  01/15/22, script for percocet 06/20/21  Additional Notes                     Vital Signs: Reviewed the patient's vital signs.   Nursing Notes: Reviewed and utilized available nursing notes.  Medical Records Reviewed: Reviewed available past medical records.  Counseling: The emergency provider has spoken with the patient and discussed today's findings, in addition to providing specific details for the plan of care.  Questions are answered and there is agreement with the plan.      MIPS DOCUMENTATION              CARDIAC STUDIES    The following cardiac studies were independently interpreted  by me the Emergency Medicine Provider.  For full cardiac study results please see chart.                                        EMERGENCY IMAGING STUDIES    The following imagine studies were independently interpreted by me (emergency medicine provider):                       RADIOLOGY IMAGING STUDIES      No orders to display       EMERGENCY DEPT. MEDICATIONS      ED Medication Orders (From admission, onward)      Start Ordered     Status Ordering Provider    01/15/22 1112 01/15/22 1111  ketorolac (TORADOL) injection 60 mg  Once        Route: Intramuscular  Ordered Dose: 60 mg     Last MAR action: Given Georgana Curio    01/15/22 1112 01/15/22 1111  predniSONE (DELTASONE) tablet 40 mg  Once        Route: Oral  Ordered Dose: 40 mg     Last MAR action: Given Ajahnae Rathgeber W            LABORATORY RESULTS    Ordered and independently interpreted AVAILABLE laboratory tests.   Results       ** No results found for the last 24 hours. **              CRITICAL CARE/PROCEDURES    Procedures  Critical care?  DIAGNOSIS      Diagnosis:  Final diagnoses:   Sciatica of left side       Disposition:  ED Disposition       ED Disposition   Discharge  Condition   --    Date/Time   Sat Jan 15, 2022 11:14 AM    Comment   Bobbye Mortonebra R Zern discharge to home/self care.    Condition at disposition: Stable                 Prescriptions:  Patient's Medications   New Prescriptions    METHYLPREDNISOLONE (MEDROL DOSEPAK) 4 MG TABLET    Use as directed    OXYCODONE-ACETAMINOPHEN (PERCOCET) 5-325 MG PER TABLET    Take 1 tablet by mouth every 4 (four) hours as needed for Pain   Previous Medications    BUPROPION XL (WELLBUTRIN XL) 150 MG 24 HR TABLET    Take 150 mg by mouth daily    DIPHENHYDRAMINE (BENADRYL) 25 MG TABLET    Take 1 tablet (25 mg total) by mouth every 6 (six) hours as needed for Itching   Modified Medications    No medications on file   Discontinued Medications    No medications on file           This note was generated by the  [employer] EMR system/ Dragon speech recognition and may contain inherent errors or omissions not intended by the user. Grammatical errors, random word insertions, deletions and pronoun errors  are occasional consequences of this technology due to software limitations. Not all errors are caught or corrected. If there are questions or concerns about the content of this note or information contained within the body of this dictation they should be addressed directly with the author for clarification.     Georgana Curioeynolds, Gage Weant W, MD  01/15/22 1125

## 2022-01-15 NOTE — ED Notes (Addendum)
Pt has low back pain on R side x 2 weeks, migrated over to lower left.  Pain in L lower back/buttocks that radiates to leg.  Pt given muscle relaxer by PCP to no effect.

## 2022-01-15 NOTE — EDIE (Signed)
COLLECTIVE?NOTIFICATION?01/15/2022 10:39?LYNIX, BONINE R?MRN: [id number]    [facility]'s patient encounter information:   VWU:?[id number]  Account [id number]  Billing Account [id number]      Criteria Met      3 Different Facilities in 90 Days    Security and Safety  No Security Events were found.  ED Care Guidelines  There are currently no ED Care Guidelines for this patient. Please check your facility's medical records system.        Prescription Monitoring Program  040??- Narcotic Use Score  020??- Sedative Use Score  000??- Stimulant Use Score  190??- Overdose Risk Score  - All Scores range from 000-999 with 75% of the population scoring < 200 and on 1% scoring above 650  - The last digit of the narcotic, sedative, and stimulant score indicates the number of active prescriptions of that type  - Higher Use scores correlate with increased prescribers, pharmacies, mg equiv, and overlapping prescriptions  - Higher Overdose Risk Scores correlate with increased risk of unintentional overdose death   Concerning or unexpectedly high scores should prompt a review of the PMP record; this does not constitute checking PMP for prescribing purposes.    E.D. Visit Count (12 mo.)  Facility Visits   [facility] 1   [place] [facility] 2   [facility] 1   Total 4   Note: Visits indicate total known visits.     Recent Emergency Department Visit Summary  Date Facility [facility] Type Diagnoses or Chief Complaint    Jan 15, 2022  [place] [facility] [facility]  [place]  [place]  Emergency      back and rt hip pain      Dec 08, 2021  [facility] [place]  [place]  Emergency      0. Pain in unspecified hip      1. Pain in right hip      2. Low back pain, unspecified      3. Nicotine dependence, cigarettes, uncomplicated      Dec 07, 2021  [place] [facility] [place] [facility]  [place]  [place]  Emergency      Leg pain      Jun 20, 2021  [place] [facility] [place] [facility]  [place]  [place]  Emergency       allergic reaction      allergic reaction to hair dye      Irritant contact dermatitis due to cosmetics        Recent Inpatient Visit Summary  No Recent Inpatient Visits were found.  Care Team  No Care Team was found.  Collective Portal  This patient has registered at the [facility] Emergency Department   For more information visit: [email or web address]     PLEASE NOTE:     1.   Any care recommendations and other clinical information are provided as guidelines or for historical purposes only, and providers should exercise their own clinical judgment when providing care.    2.   You may only use this information for purposes of treatment, payment or health care operations activities, and subject to the limitations of applicable Collective Policies.    3.   You should consult directly with the organization that provided a care guideline or other clinical history with any questions about additional information or accuracy or completeness of information provided.    ? 2023 [place] - [email or web address]

## 2022-02-25 ENCOUNTER — Emergency Department: Payer: Medicare Other

## 2022-02-25 ENCOUNTER — Encounter: Payer: Self-pay | Admitting: Emergency Medicine

## 2022-02-25 ENCOUNTER — Other Ambulatory Visit: Payer: Self-pay

## 2022-02-25 ENCOUNTER — Emergency Department
Admission: EM | Admit: 2022-02-25 | Discharge: 2022-02-25 | Disposition: A | Discharge status: Home / Self Care | Payer: Medicare Other | Attending: Emergency Medicine | Admitting: Emergency Medicine

## 2022-02-25 DIAGNOSIS — X58XXXA Exposure to other specified factors, initial encounter: Secondary | ICD-10-CM | POA: Insufficient documentation

## 2022-02-25 DIAGNOSIS — S32592A Other specified fracture of left pubis, initial encounter for closed fracture: Secondary | ICD-10-CM | POA: Diagnosis not present

## 2022-02-25 DIAGNOSIS — S79912A Unspecified injury of left hip, initial encounter: Secondary | ICD-10-CM | POA: Diagnosis present

## 2022-02-25 MED ORDER — KETOROLAC TROMETHAMINE 30 MG/ML IJ SOLN
30.0000 mg | Freq: Once | INTRAMUSCULAR | Status: AC
  Administered 2022-02-25: 30 mg via INTRAMUSCULAR
  Filled 2022-02-25: qty 1

## 2022-02-25 MED ORDER — LIDOCAINE 5 % EX PTCH
1.0000 | MEDICATED_PATCH | Freq: Once | CUTANEOUS | Status: DC
  Administered 2022-02-25: 1 via TRANSDERMAL
  Filled 2022-02-25: qty 1

## 2022-02-25 MED ORDER — OXYCODONE-ACETAMINOPHEN 5-325 MG PO TABS: 1.0000 | ORAL_TABLET | ORAL | 0 refills | Status: AC | PRN

## 2022-02-25 NOTE — ED Notes (Signed)
E signature pad not working. Pt educated on discharge instructions and verbalized understanding.  

## 2022-02-25 NOTE — ED Notes (Signed)
See triage note presents with pain into left leg   feels like sciatica that she has had in the past   ?

## 2022-02-25 NOTE — ED Provider Notes (Signed)
? ?[facility] ?Provider Note ? ? ? Event Date/Time  ? First MD Initiated Contact with Patient 02/25/22 1711   ?  (approximate) ? ? ?History  ? ?Chief Complaint ?Leg Pain ? ? ?HPI ? ?Cassidy Erickson is a 61 y.o. female with no significant past medical history presents to the ED complaining of leg pain.  Patient reports that she has been dealing with a couple of days of pain in her left groin area as well as her left upper thigh.  Pain is described as sharp and constant, exacerbated when she tries to move the leg or bear weight on her left leg.  She has dealt with some pain in the left lower part of her back but denies any pain shooting down the back of her leg.  She has not had any numbness or weakness in the leg and denies any numbness in her groin.  She has not had any urinary retention or incontinence.  She denies any traumatic injury to her hip or back.  She does state that a couple of years ago she was diagnosed with a hip fracture without any traumatic mechanism, eventually had surgery at [facility] ?  ? ? ?Physical Exam  ? ?Triage Vital Signs: ?ED Triage Vitals  ?Enc Vitals Group  ?   BP 02/25/22 1551 (!) 154/111  ?   Pulse Rate 02/25/22 1551 90  ?   Resp 02/25/22 1551 17  ?   Temp 02/25/22 1551 97.8 ?F (36.6 ?C)  ?   Temp Source 02/25/22 1551 Oral  ?   SpO2 02/25/22 1551 98 %  ?   Weight 02/25/22 1550 98 lb 1.7 oz (44.5 kg)  ?   Height 02/25/22 1550 '5\' 4"'$  (1.626 m)  ?   Head Circumference --   ?   Peak Flow --   ?   Pain Score 02/25/22 1550 10  ?   Pain Loc --   ?   Pain Edu? --   ?   Excl. in [place]? --   ? ? ?Most recent vital signs: ?Vitals:  ? 02/25/22 1551  ?BP: (!) 154/111  ?Pulse: 90  ?Resp: 17  ?Temp: 97.8 ?F (36.6 ?C)  ?SpO2: 98%  ? ? ?Constitutional: Alert and oriented. ?Eyes: Conjunctivae are normal. ?Head: Atraumatic. ?Nose: No congestion/rhinnorhea. ?Mouth/Throat: Mucous membranes are moist.  ?Cardiovascular: Normal rate, regular rhythm. Grossly normal heart sounds.  2+ radial and DP  pulses bilaterally. ?Respiratory: Normal respiratory effort.  No retractions. Lungs CTAB. ?Gastrointestinal: Soft and nontender. No distention. ?Musculoskeletal: No tenderness to palpation noted at midline lumbar spine or paraspinal area.  No tenderness noted over left hip or groin.  Range of motion to left hip intact with pain, range of motion intact at left knee and ankle without pain. ?Neurologic:  Normal speech and language. No gross focal neurologic deficits are appreciated. ? ? ? ?ED Results / Procedures / Treatments  ? ?Labs ?(all labs ordered are listed, but only abnormal results are displayed) ?Labs Reviewed - No data to display ? ?RADIOLOGY ?Left hip x-ray reviewed by me with hardware for prior femoral neck fracture appropriately positioned, left pubic rami fracture noted. ? ?PROCEDURES: ? ?Critical Care performed: No ? ?Procedures ? ? ?MEDICATIONS ORDERED IN ED: ?Medications  ?lidocaine (LIDODERM) 5 % 1 patch (1 patch Transdermal Patch Applied 02/25/22 1855)  ?ketorolac (TORADOL) 30 MG/ML injection 30 mg (30 mg Intramuscular Given 02/25/22 1855)  ? ? ? ?IMPRESSION / MDM / ASSESSMENT AND PLAN /  ED COURSE  ?I reviewed the triage vital signs and the nursing notes. ?             ?               ? ?61 y.o. female with no significant past medical history who presents to the ED complaining of 2 days of pain in her left upper thigh and groin area without associated trauma. ? ?Differential diagnosis includes, but is not limited to, hip fracture, septic arthritis, osteoarthritis, lumbar radiculopathy, DVT, cellulitis. ? ?Patient nontoxic-appearing and in no acute distress, vital signs are unremarkable and she is neurovascular intact to her left lower extremity.  No findings to suggest cauda equina and symptoms do not appear consistent with a lumbar radiculopathy.  She has no signs of infectious process and no leg swelling to suggest DVT.  Given she has experienced fractures without traumatic mechanism in the past,  x-ray of her left hip and lumbar spine was performed.  Lumbar spine x-ray is unremarkable, but patient found to have pubic rami fracture on the left, explaining her symptoms.  She may weight-bear as tolerated and was provided with crutches.  She has been able to tolerate the pain with a dose of Toradol and Lidoderm patch here in the ED, is appropriate for discharge home with orthopedic follow-up.  She was prescribed a short course of pain medication and was counseled to return to the ED for new worsening symptoms, patient agrees with plan. ? ?  ? ? ?FINAL CLINICAL IMPRESSION(S) / ED DIAGNOSES  ? ?Final diagnoses:  ?Closed fracture of multiple rami of left pubis, initial encounter ([place])  ? ? ? ?Rx / DC Orders  ? ?ED Discharge Orders   ? ?      Ordered  ?  oxyCODONE-acetaminophen (PERCOCET) 5-325 MG tablet  Every 4 hours PRN       ? 02/25/22 1859  ? ?  ?  ? ?  ? ? ? ?Note:  This document was prepared using Dragon voice recognition software and may include unintentional dictation errors. ?  ?Blake Divine, MD ?02/25/22 1905 ? ?

## 2022-02-25 NOTE — ED Triage Notes (Signed)
Pt comes into the ED via POV c/o left leg pain.  Pt has h/o sciatica and she states this feels the same.  Pain radiates from upper buttock down through the leg with sharp pain with movement.  Pt in NAD at this time.  ?

## 2022-08-25 IMAGING — CR DG HIP (WITH OR WITHOUT PELVIS) 2-3V*L*
3 series · 3 of 3 positions shown · non-contrast
Comparison: Radiographs dated May 31, 2019

CLINICAL DATA: Left leg and hip pain.

EXAM:
DG HIP (WITH OR WITHOUT PELVIS) 2-3V LEFT

[pelvis ap]
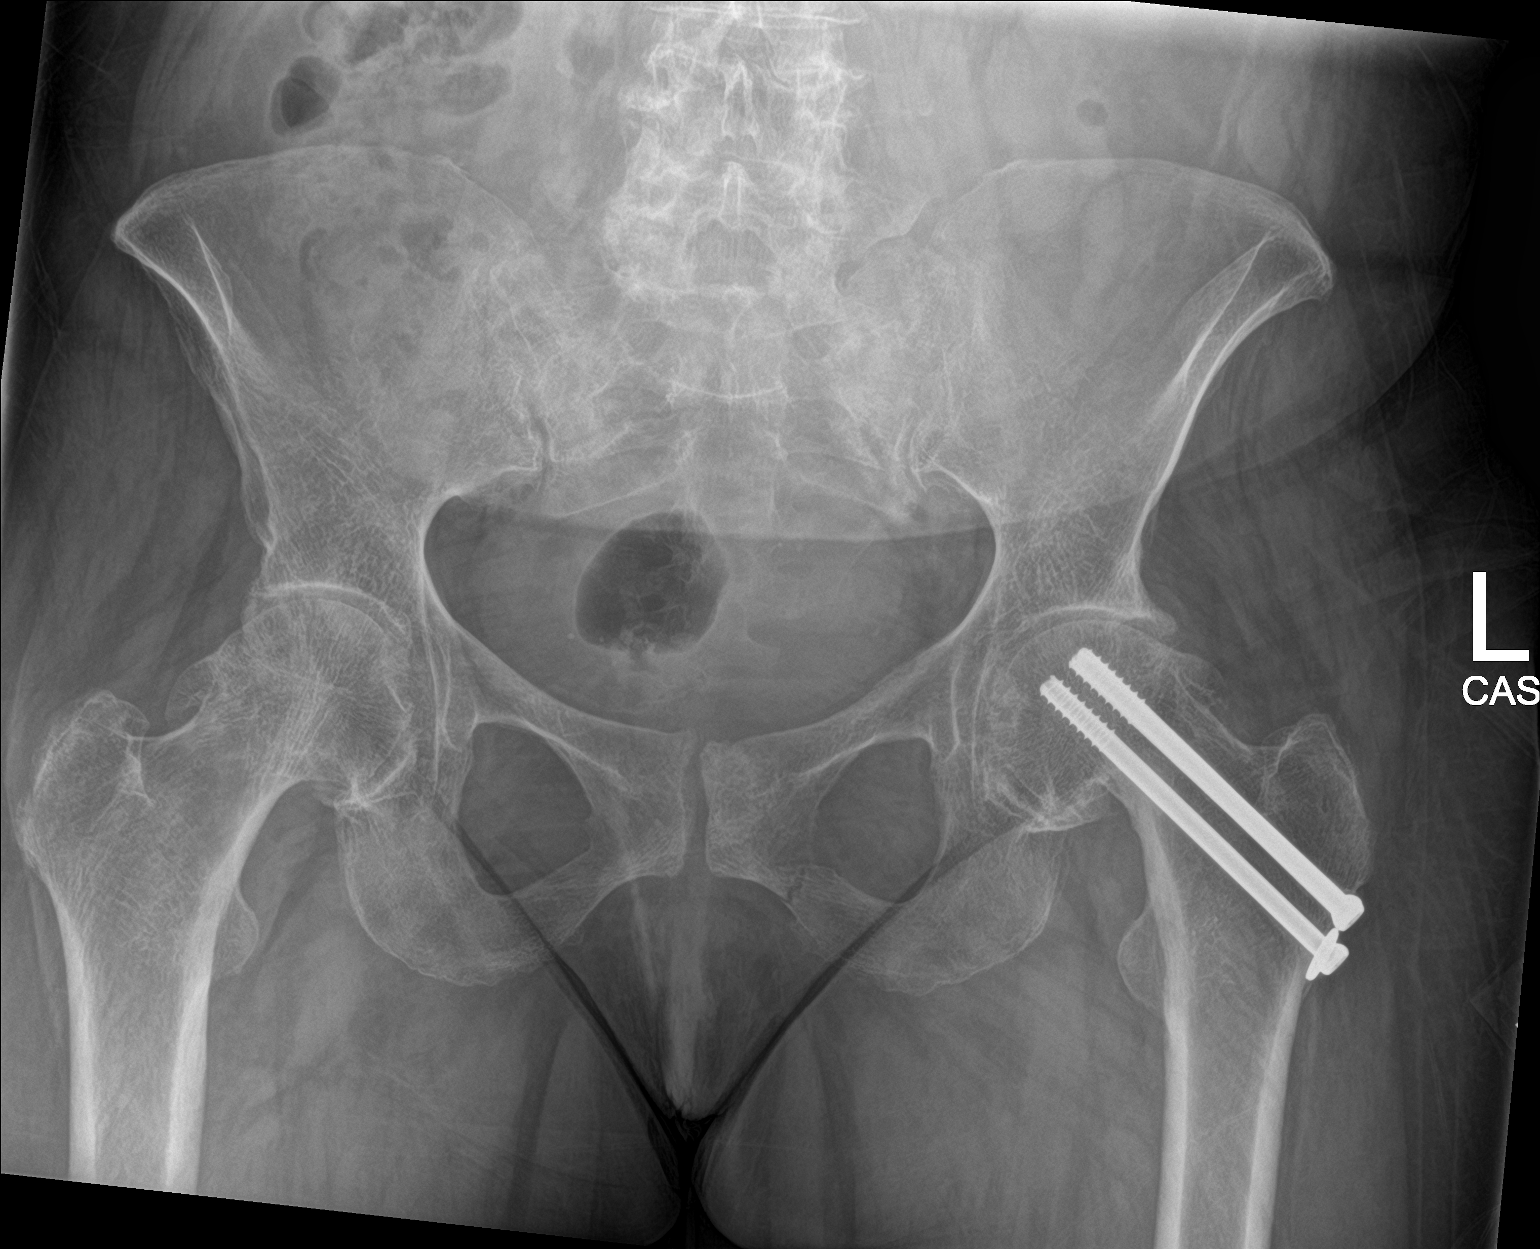

[hip ap]
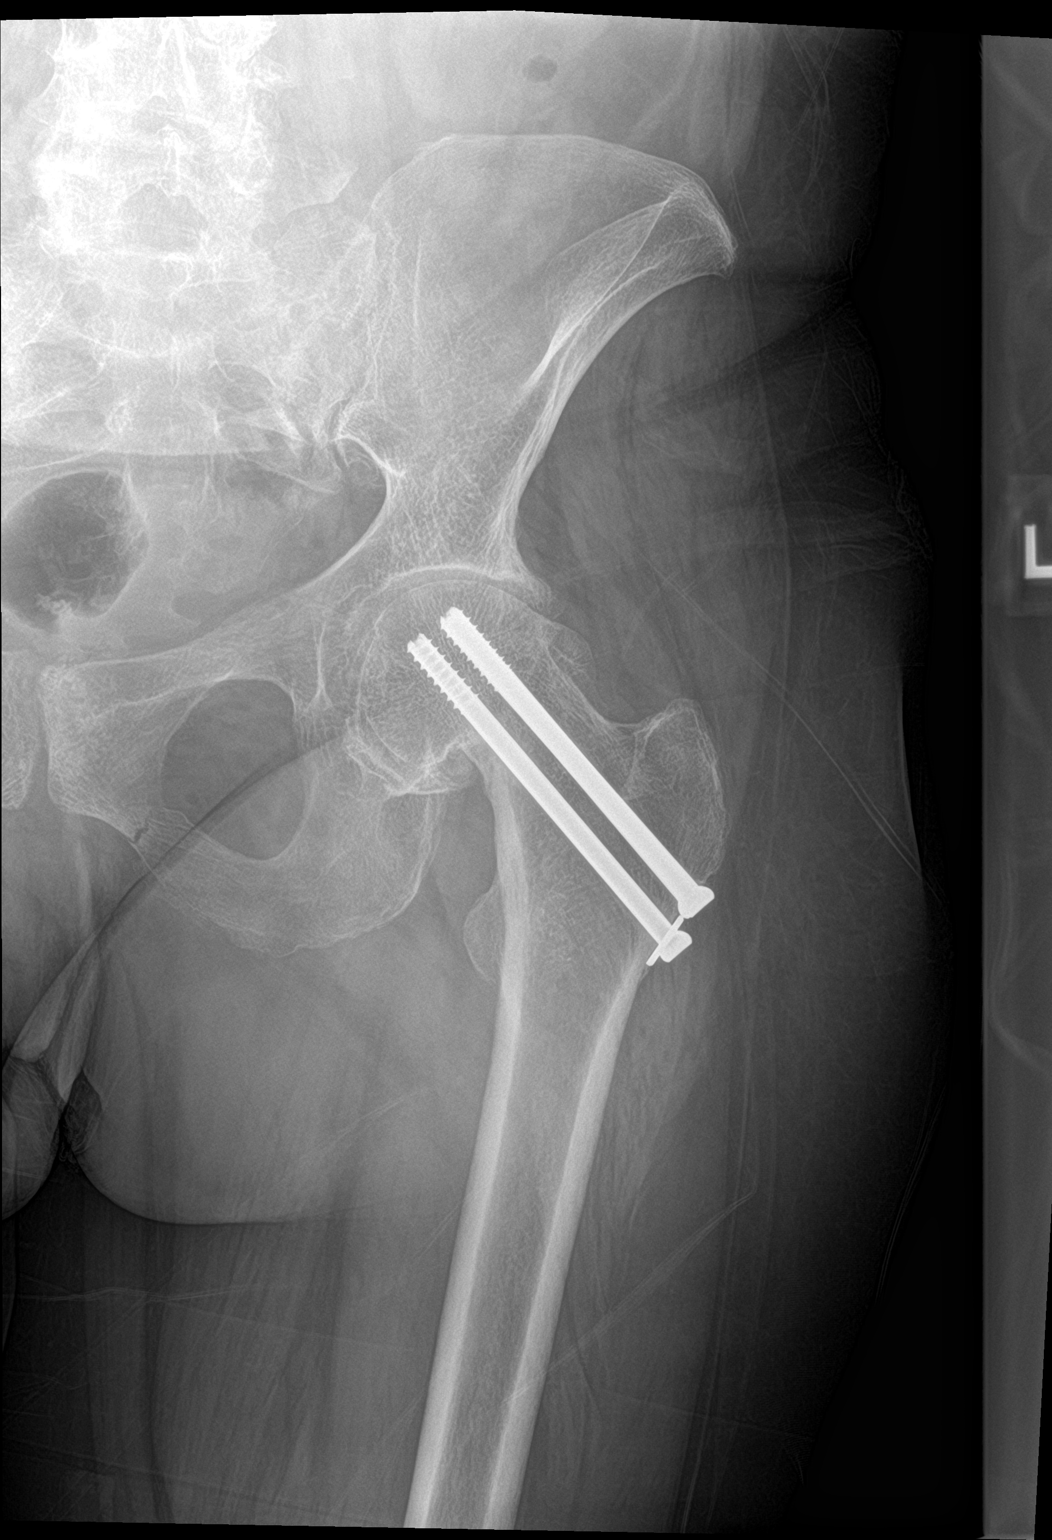

[hip lat]
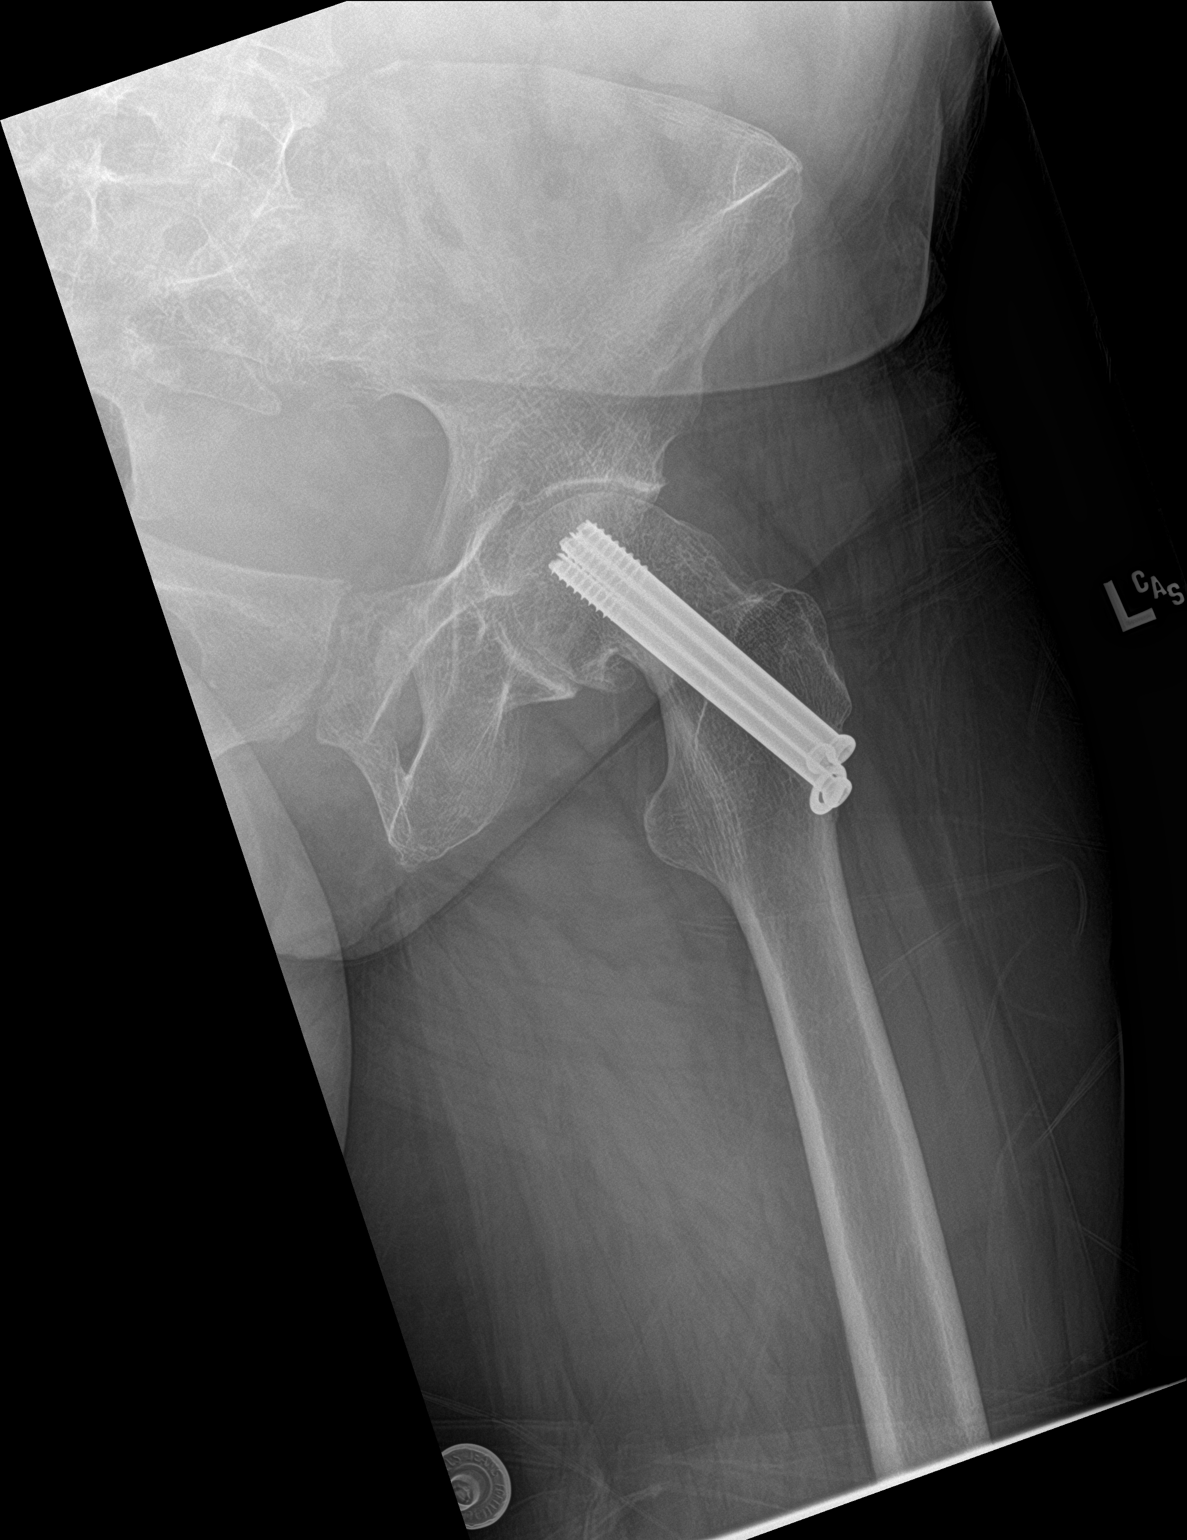

[3 of 3 positions shown; findings below may reference images not displayed]

FINDINGS: There is a mildly displaced fracture of the left inferior pubic
ramus. Prior internal fixation of the left femoral neck fracture
with mild-to-moderate hip arthropathy. Sacroiliac joints and pubic
symphysis appear maintained. Moderate right hip osteoarthritis.
IMPRESSION: Mildly displaced fracture of the left inferior pubic ramus.

## 2022-11-12 ENCOUNTER — Ambulatory Visit
Admission: EM | Admit: 2022-11-12 | Discharge: 2022-11-12 | Disposition: A | Discharge status: Home / Self Care | Payer: Medicare HMO | Attending: Family Medicine | Admitting: Family Medicine

## 2022-11-12 ENCOUNTER — Other Ambulatory Visit: Payer: Self-pay

## 2022-11-12 DIAGNOSIS — H1031 Unspecified acute conjunctivitis, right eye: Secondary | ICD-10-CM

## 2022-11-12 MED ORDER — POLYMYXIN B-TRIMETHOPRIM 10000-0.1 UNIT/ML-% OP SOLN: 2.0000 [drp] | Freq: Four times a day (QID) | OPHTHALMIC | 0 refills | Status: DC

## 2022-11-12 NOTE — ED Provider Notes (Incomplete)
[facility]    CSN: [id number] Arrival date & time: 11/12/22  1454      History   Chief Complaint Chief Complaint  Patient presents with   Eye Drainage    HPI HPI  GERTHA LICHTENBERG is a 61 y.o. female.    Sherrey presents for right eye discharge a couple days ago. Looking at the light hurts it. Nothing got in her eye. She wakes up in the morning with crusting in her eye and her eye drains throughtout the day. She wears contacts.      eye pain that started ***.  Reports ***events around eye pain. Jeslyn feels like something is ***still in his eye but does not see anything. ***treatments tried prior to arrival.   Katoria does not wear glasses or contacts.  Jadyn has not had any trouble seeing or discharge from the eye.  However, ***eye pain remains.  Liisa has otherwise been well and has no additional concerns today.   Fever : no  Cough: no Nasal congestion : no  Appetite: normal  Hydration: normal  Vomiting: no Dysuria: no  Sleep disturbance: no Back Pain: no Headache: no   History reviewed. No pertinent past medical history.  Patient Active Problem List   Diagnosis Date Noted   Personal history of colonic polyps    Polyp of colon     Past Surgical History:  Procedure Laterality Date   COLONOSCOPY WITH PROPOFOL N/A 07/03/2019   Procedure: COLONOSCOPY WITH PROPOFOL;  Surgeon: Jonathon Bellows, MD;  Location: [facility] ENDOSCOPY;  Service: Gastroenterology;  Laterality: N/A;   FEMUR FRACTURE SURGERY Left 2018   TUBAL LIGATION  1986    OB History   No obstetric history on file.      Home Medications    Prior to Admission medications   Medication Sig Start Date End Date Taking? Authorizing Provider  buPROPion [facility] SR) 150 MG 12 hr tablet  02/23/22  Yes [provider]  orphenadrine (NORFLEX) 100 MG tablet Take by mouth. 06/06/22  Yes [provider]  cholecalciferol (VITAMIN D3) 25 MCG (1000 UNIT) tablet Take 1,000 Units by mouth  daily.    [provider]  cyclobenzaprine (FLEXERIL) 10 MG tablet Take 10 mg by mouth 3 (three) times daily as needed for muscle spasms.    [provider]  omeprazole (PRILOSEC) 20 MG capsule Take 20 mg by mouth daily.    [provider]  oxyCODONE-acetaminophen (PERCOCET) 5-325 MG tablet Take 1 tablet by mouth every 4 (four) hours as needed for severe pain. 02/25/22 02/25/23  Blake Divine, MD    Family History History reviewed. No pertinent family history.  Social History Social History   Tobacco Use   Smoking status: Every Day    Packs/day: 0.50    Types: Cigarettes   Smokeless tobacco: Never  Vaping Use   Vaping Use: Never used  Substance Use Topics   Alcohol use: Yes    Alcohol/week: 14.0 standard drinks of alcohol    Types: 14 Cans of beer per week    Comment: 2 beers/day   Drug use: Never     Allergies   Erythromycin   Review of Systems Review of Systems : negative unless otherwise stated in HPI.      Physical Exam Triage Vital Signs ED Triage Vitals  Enc Vitals Group     BP 11/12/22 1526 (!) 141/86     Pulse Rate 11/12/22 1526 80     Resp 11/12/22 1526 16  Temp 11/12/22 1526 98.2 F (36.8 C)     Temp Source 11/12/22 1526 Oral     SpO2 11/12/22 1526 98 %     Weight 11/12/22 1523 120 lb (54.4 kg)     Height 11/12/22 1523 '5\' 5"'$  (1.651 m)     Head Circumference --      Peak Flow --      Pain Score 11/12/22 1523 4     Pain Loc --      Pain Edu? --      Excl. in [place]? --    No data found.  Updated Vital Signs BP (!) 141/86 (BP Location: Left Arm)   Pulse 80   Temp 98.2 F (36.8 C) (Oral)   Resp 16   Ht '5\' 5"'$  (1.651 m)   Wt 54.4 kg   SpO2 98%   BMI 19.97 kg/m   Visual Acuity Right Eye Distance:   Left Eye Distance:   Bilateral Distance:     Left eye corrected 20/30 Right eye corrected 20/70 Both eyes corrected 20/40   Physical Exam  GEN: pleasant well appearing female, in no acute distress *** NECK:  normal ROM  CV: regular rate ***and rhythm, no murmurs appreciated *** RESP: no increased work of breathing, ***clear to ascultation bilaterally EYES:     General: Lids are normal. Lids are everted, no foreign bodies appreciated. Vision grossly intact. Gaze aligned appropriately.        Right eye: No discharge.        Left eye: No foreign body, discharge or hordeolum.     Extraocular Movements: Extraocular movements intact.     Conjunctiva/sclera:     Left eye: ***Left conjunctiva is injected. No chemosis or hemorrhage.    Comments: ***fluorescein stain performed, Corneal abrasions in the 8 through 10 o'clock position and 3 through 5 o'clock positions, saline rinsed and inspected for foreign bodies  SKIN: warm and dry   UC Treatments / Results  Labs (all labs ordered are listed, but only abnormal results are displayed) Labs Reviewed - No data to display  EKG   Radiology No results found.  Procedures Procedures (including critical care time)  Medications Ordered in UC Medications - No data to display  Initial Impression / Assessment and Plan / UC Course  I have reviewed the triage vital signs and the nursing notes.  Pertinent labs & imaging results that were available during my care of the patient were reviewed by me and considered in my medical decision making (see chart for details).     Patient is a 61 y.o. female who presents after ***left / right eye pain / ***discharge for the past  *** days.  On exam, she has a ***evidence of conjunctivitis on the ***right. Treat with Polytrim eye drops.  Advised to follow-up with an ophthalmologist or optometrist, if  discomfort/pain is not improving after 5-7day course.  ***she has corneal abrasion seen on fluorescein stain.  Sent ***erythromycin ointment to the pharmacy.  Advised to follow-up with an ophthalmologist or optometrist, if  discomfort/pain is not improving after 5-7day course.  Recommended ***pt pick up eye patch from the  pharmacy, if desired. Understanding voiced.   Discussed MDM, treatment plan and plan for follow-up with patient/parent who agrees with plan.  Final Clinical Impressions(s) / UC Diagnoses   Final diagnoses:  None   Discharge Instructions   None    ED Prescriptions   None    PDMP not reviewed this encounter.

## 2022-11-12 NOTE — Discharge Instructions (Addendum)
Stop by the pharmacy to pick up your prescriptions. Follow up with your eye doctor if symptoms are not improving with antibiotic drops. DO NOT wear your contacts for the next week.  Consider purchasing a eye patch while you are at the pharmacy.

## 2022-11-12 NOTE — ED Triage Notes (Signed)
Pt reports right eye infection 2 days ago. Redness, drainage, photophobia. VISUAL ACUITY in triage Left eye corrected 20/30 Right eye corrected 20/70 Both eyes corrected 20/40

## 2022-12-30 ENCOUNTER — Encounter: Payer: Self-pay | Admitting: Podiatry

## 2022-12-30 ENCOUNTER — Ambulatory Visit: Payer: Medicare HMO | Admitting: Podiatry

## 2022-12-30 VITALS — BP 127/70 | HR 66

## 2022-12-30 DIAGNOSIS — Q828 Other specified congenital malformations of skin: Secondary | ICD-10-CM

## 2022-12-30 NOTE — Progress Notes (Signed)
  Subjective:  Patient ID: Cassidy Erickson, female    DOB: 03-07-1961,  MRN: [id number]  Chief Complaint  Patient presents with   Foot Pain    "This callus hurts really bad.  I can be just sitting and it hurts, throbs.  It's been hurting extensively for about three weeks.  It hurt before but it stopped."    62 y.o. female presents with the above complaint.  Patient presents with complaint left submetatarsal 5 porokeratotic lesion painful to touch is progressive gotten worse hurts with ambulation hurts with pressure.  She would like to discuss treatment options for it she has not seen anyone else prior to seeing me for this.  Pain scale 7 out of 10 dull achy in nature.   Review of Systems: Negative except as noted in the HPI. Denies N/V/F/Ch.  No past medical history on file.  Current Outpatient Medications:    buPROPion (WELLBUTRIN SR) 150 MG 12 hr tablet, , Disp: , Rfl:    cholecalciferol (VITAMIN D3) 25 MCG (1000 UNIT) tablet, Take 1,000 Units by mouth daily., Disp: , Rfl:    cyclobenzaprine (FLEXERIL) 10 MG tablet, Take 10 mg by mouth 3 (three) times daily as needed for muscle spasms., Disp: , Rfl:    omeprazole (PRILOSEC) 20 MG capsule, Take 20 mg by mouth daily., Disp: , Rfl:    orphenadrine (NORFLEX) 100 MG tablet, Take by mouth., Disp: , Rfl:    oxyCODONE-acetaminophen (PERCOCET) 5-325 MG tablet, Take 1 tablet by mouth every 4 (four) hours as needed for severe pain. (Patient not taking: Reported on 12/30/2022), Disp: 12 tablet, Rfl: 0   trimethoprim-polymyxin b (POLYTRIM) ophthalmic solution, Place 2 drops into the right eye every 6 (six) hours. For 7 days, Disp: 10 mL, Rfl: 0  Social History   Tobacco Use  Smoking Status Every Day   Packs/day: 0.50   Types: Cigarettes  Smokeless Tobacco Never  Tobacco Comments   Using the patch now, trying to quit - 12/30/22    Allergies  Allergen Reactions   Erythromycin Nausea And Vomiting   Objective:   Vitals:   12/30/22 1004  BP:  127/70  Pulse: 66   There is no height or weight on file to calculate BMI. Constitutional Well developed. Well nourished.  Vascular Dorsalis pedis pulses palpable bilaterally. Posterior tibial pulses palpable bilaterally. Capillary refill normal to all digits.  No cyanosis or clubbing noted. Pedal hair growth normal.  Neurologic Normal speech. Oriented to person, place, and time. Epicritic sensation to light touch grossly present bilaterally.  Dermatologic Left submetatarsal 5 porokeratotic lesion.  Painful to touch plantarflexed fifth metatarsal noted.  Lesion debrided down.  No pinpoint bleeding noted  Orthopedic: Normal joint ROM without pain or crepitus bilaterally. No visible deformities. No bony tenderness.   Radiographs: None Assessment:   1. Porokeratosis    Plan:  Patient was evaluated and treated and all questions answered.  Left submetatarsal 5 porokeratosis -All questions and concerns were discussed with the patient in extensive detail given the amount of pain that she is having as a courtesy the lesion was debrided down to healthy striated tissue no complication noted no pinpoint bleeding noted.  I discussed shoe gear modification padding offloading.  No follow-ups on file.

## 2023-06-12 ENCOUNTER — Ambulatory Visit
Admission: RE | Admit: 2023-06-12 | Discharge: 2023-06-12 | Disposition: A | Discharge status: Home / Self Care | Payer: Medicare Other | Source: Ambulatory Visit | Attending: Pain Medicine | Admitting: Pain Medicine

## 2023-06-12 ENCOUNTER — Ambulatory Visit (HOSPITAL_BASED_OUTPATIENT_CLINIC_OR_DEPARTMENT_OTHER): Payer: Medicare Other | Admitting: Pain Medicine

## 2023-06-12 ENCOUNTER — Other Ambulatory Visit: Payer: Self-pay | Admitting: Pain Medicine

## 2023-06-12 ENCOUNTER — Encounter: Payer: Self-pay | Admitting: Pain Medicine

## 2023-06-12 VITALS — BP 121/69 | HR 79 | Temp 97.3°F | Resp 16 | Ht 65.0 in | Wt 120.0 lb

## 2023-06-12 DIAGNOSIS — Z789 Other specified health status: Secondary | ICD-10-CM

## 2023-06-12 DIAGNOSIS — T7840XA Allergy, unspecified, initial encounter: Secondary | ICD-10-CM | POA: Insufficient documentation

## 2023-06-12 DIAGNOSIS — M899 Disorder of bone, unspecified: Secondary | ICD-10-CM | POA: Insufficient documentation

## 2023-06-12 DIAGNOSIS — M5136 Other intervertebral disc degeneration, lumbar region: Secondary | ICD-10-CM | POA: Insufficient documentation

## 2023-06-12 DIAGNOSIS — M47817 Spondylosis without myelopathy or radiculopathy, lumbosacral region: Secondary | ICD-10-CM | POA: Insufficient documentation

## 2023-06-12 DIAGNOSIS — M545 Low back pain, unspecified: Secondary | ICD-10-CM | POA: Insufficient documentation

## 2023-06-12 DIAGNOSIS — G894 Chronic pain syndrome: Secondary | ICD-10-CM | POA: Insufficient documentation

## 2023-06-12 DIAGNOSIS — M4316 Spondylolisthesis, lumbar region: Secondary | ICD-10-CM | POA: Insufficient documentation

## 2023-06-12 DIAGNOSIS — M79604 Pain in right leg: Secondary | ICD-10-CM | POA: Insufficient documentation

## 2023-06-12 DIAGNOSIS — M5459 Other low back pain: Secondary | ICD-10-CM

## 2023-06-12 DIAGNOSIS — G8929 Other chronic pain: Secondary | ICD-10-CM | POA: Insufficient documentation

## 2023-06-12 DIAGNOSIS — M25552 Pain in left hip: Secondary | ICD-10-CM | POA: Insufficient documentation

## 2023-06-12 DIAGNOSIS — Z791 Long term (current) use of non-steroidal anti-inflammatories (NSAID): Secondary | ICD-10-CM | POA: Insufficient documentation

## 2023-06-12 DIAGNOSIS — S32592S Other specified fracture of left pubis, sequela: Secondary | ICD-10-CM | POA: Insufficient documentation

## 2023-06-12 DIAGNOSIS — Z79899 Other long term (current) drug therapy: Secondary | ICD-10-CM | POA: Insufficient documentation

## 2023-06-12 DIAGNOSIS — S72142A Displaced intertrochanteric fracture of left femur, initial encounter for closed fracture: Secondary | ICD-10-CM | POA: Insufficient documentation

## 2023-06-12 DIAGNOSIS — M25551 Pain in right hip: Secondary | ICD-10-CM | POA: Insufficient documentation

## 2023-06-12 NOTE — Progress Notes (Signed)
Safety precautions to be maintained throughout the outpatient stay will include: orient to surroundings, keep bed in low position, maintain call bell within reach at all times, provide assistance with transfer out of bed and ambulation.  

## 2023-06-12 NOTE — Progress Notes (Signed)
Patient: Cassidy Erickson  Service Category: E/M  Provider: Oswaldo Done, MD  DOB: December 22, 1960  DOS: 06/12/2023  Referring Provider: Lacretia Leigh*  MRN: [id number]  Setting: Ambulatory outpatient  PCP: Judeen Hammans, MD  Type: New Patient  Specialty: Interventional Pain Management    Location: Office  Delivery: Face-to-face     Primary Reason(s) for Visit: Encounter for initial evaluation of one or more chronic problems (new to examiner) potentially causing chronic pain, and posing a threat to normal musculoskeletal function. (Level of risk: High) CC: Back Pain (lower)  HPI  Ms. Cassidy Erickson is a 62 y.o. year old, female patient, who comes for the first time to our practice referred by Lacretia Leigh* for our initial evaluation of her chronic pain. 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 Today she comes in for evaluation of her Back Pain (lower)  Pain Assessment: Location: Lower Back Radiating: buttocks/hip biltaeral  down side or right leg to knee Onset: More than a month ago Duration: Chronic pain Quality: Aching, Constant, Jabbing,  Moaning, Guarding, Discomfort Severity: 8 /10 (subjective, self-reported pain score)  Effect on ADL: prolonged walking, standing, walking,, activites Timing: Constant Modifying factors: heat, rest, strech BP: 121/69  HR: 79  Onset and Duration: Gradual and Present longer than 3 months Cause of pain: Unknown Severity: Getting worse, NAS-11 at its worse: 10/10, NAS-11 at its best: 8/10, NAS-11 now: 8/10, and NAS-11 on the average: 8/10 Timing: Morning, Evening, and Not influenced by the time of the day Aggravating Factors: Bending, Climbing, Lifiting, Motion, Squatting, Walking, Walking uphill, and Walking downhill Alleviating Factors: Bending, Stretching, Resting, Sitting, and Relaxation therapy Associated Problems: Inability to concentrate Quality of Pain: Aching, Constant, Dull, Horrible, Pressure-like, and Sharp Previous Examinations or Tests: Bone scan Previous Treatments: Chiropractic manipulations  [name] is being evaluated for possible interventional pain management therapies for the treatment of her chronic pain.   According to the patient the primary area of pain is that of the lower back (Bilateral) (L>R).  She denies any prior back surgeries but she does admit to having had physical therapy and chiropractic manipulations which did not help her low back pain.  She denies ever having had any nerve blocks but does admit to having had some x-rays more than a year ago.  No other recent x-rays or MRIs.  The patient's secondary area pain is that of the hips (Bilateral) (R>L).  She describes having had a left femoral fracture around October 2018 that required surgery (intramedullary nailing).  She is still having pain in that area and she describes that it has gotten worse over the years.  When asked to point that the area of pain which she indicates corresponds to her hips, she points at the posterior upper buttocks area just lateral to the PSIS.  She  states having had some physical  therapy more than 15 years ago and also she refers having had the surgery for the fracture, but no other hip surgeries.  She denies any recent x-rays but she seems to recall having had a right hip injection at a [facility] around 2014.  She states that this injection did help her pain for approximately 6 weeks.  The patient's third area pain is that of the right lower extremity where the pain runs through the back of the leg down to mid calf region.  She denies any lower extremity numbness or weakness.  She denies any surgeries, physical therapy, recent x-rays, nerve blocks, or nerve conduction test of that lower extremity.  The patient specifically denies any pain, numbness, or weakness affecting her ankle or foot.  Physical exam: The patient was able to toe walk and heel walk without any problems.  Lumbar flexion seem to be relatively within normal limits but the patient did present with a "double stage recovery" maneuver from the flexion.  Lumbar hyperextension demonstrated decreased range of motion of the lumbar spine with triggering of pain across the lower lumbar area.  Attempted lumbar hyperextension on rotation maneuver again demonstrated decreased range of motion bilaterally with bilateral ipsilateral low back pain being triggered suggestive of bilateral lumbar facet arthralgia and concordant with the [facility] maneuver.  Ms. Hower has been informed that this initial visit was an evaluation only.  On the follow up appointment I will go over the results, including ordered tests and available interventional therapies. At that time she will have the opportunity to decide whether to proceed with offered therapies or not. In the event that Ms. Cowles prefers avoiding interventional options, this will conclude our involvement in the case.  Medication management recommendations may be provided upon request.  Historic Controlled Substance Pharmacotherapy Review  PMP and historical list of controlled substances:  Oxycodone/APAP 5/325, 1 tab p.o. 4 times daily (# 12) (last filled on 02/26/2022) Most recently prescribed opioid analgesics:   None MME/day: 0 mg/day  Historical Monitoring: The patient  reports no history of drug use. List of prior UDS Testing: No results found for: "MDMA", "COCAINSCRNUR", "PCPSCRNUR", "PCPQUANT", "CANNABQUANT", "THCU", "ETH", "CBDTHCR", "D8THCCBX", "D9THCCBX" Historical Background Evaluation: [place] PMP: PDMP reviewed during this encounter. Review of the past 44-months conducted.             PMP NARX Score Report:  Narcotic: 050 Sedative: 020 Stimulant: 000 [place] Department of public safety, offender search: Engineer, mining Information) Non-contributory Risk Assessment Profile: Aberrant behavior: None observed or detected today Risk factors for fatal opioid overdose: None identified today PMP NARX Overdose Risk Score: 160 Fatal overdose hazard ratio (HR): Calculation deferred Non-fatal overdose hazard ratio (HR): Calculation deferred Risk of opioid abuse or dependence: 0.7-3.0% with doses ? 36 MME/day and 6.1-26% with doses ? 120 MME/day. Substance use disorder (SUD) risk level: See below Personal History of Substance Abuse (SUD-Substance use disorder):  Alcohol: Negative  Illegal Drugs: Negative  Rx Drugs: Negative  ORT Risk Level calculation: Low Risk  Opioid Risk Tool - 06/12/23 1120       Family History of Substance Abuse   Alcohol Negative    Illegal Drugs Negative    Rx Drugs Negative      Personal History of Substance Abuse   Alcohol Negative    Illegal Drugs Negative    Rx Drugs Negative      Age   Age between 16-45 years  No      Psychological  Disease   Psychological Disease Negative    Depression Negative      Total Score   Opioid Risk Tool Scoring 0    Opioid Risk Interpretation Low Risk            ORT Scoring interpretation table:  Score <3 = Low Risk for SUD  Score between 4-7 = Moderate Risk for SUD  Score >8 = High Risk for Opioid Abuse    PHQ-2 Depression Scale:  Total score: 2  PHQ-2 Scoring interpretation table: (Score and probability of major depressive disorder)  Score 0 = No depression  Score 1 = 15.4% Probability  Score 2 = 21.1% Probability  Score 3 = 38.4% Probability  Score 4 = 45.5% Probability  Score 5 = 56.4% Probability  Score 6 = 78.6% Probability   PHQ-9 Depression Scale:  Total score: 7  PHQ-9 Scoring interpretation table:  Score 0-4 = No depression  Score 5-9 = Mild depression  Score 10-14 = Moderate depression  Score 15-19 = Moderately severe depression  Score 20-27 = Severe depression (2.4 times higher risk of SUD and 2.89 times higher risk of overuse)   Pharmacologic Plan: As per protocol, I have not taken over any controlled substance management, pending the results of ordered tests and/or consults.            Initial impression: Pending review of available data and ordered tests.  Meds   Current Outpatient Medications:    buPROPion (WELLBUTRIN SR) 150 MG 12 hr tablet, , Disp: , Rfl:    cholecalciferol (VITAMIN D3) 25 MCG (1000 UNIT) tablet, Take 1,000 Units by mouth daily., Disp: , Rfl:    ibuprofen (ADVIL) 600 MG tablet, , Disp: , Rfl:    naproxen (NAPROSYN) 500 MG tablet, , Disp: , Rfl:    naproxen sodium (ALEVE) 220 MG tablet, Take by mouth., Disp: , Rfl:    omeprazole (PRILOSEC) 20 MG capsule, Take 20 mg by mouth daily., Disp: , Rfl:   Imaging Review  Lumbosacral Imaging: Lumbar DG 2-3 views: Results for orders placed during the hospital encounter of 02/25/22 DG Lumbar Spine 2-3 Views  Narrative CLINICAL DATA:  Left leg pain.  EXAM: LUMBAR SPINE - 2-3 VIEW  COMPARISON:  None.  FINDINGS: No evidence of acute fracture. Grade 1 anterolisthesis of L4. Multilevel degenerate disc disease of the lumbar spine prominent at L4-L5 and L5-S1 with associated facet joint arthropathy.  IMPRESSION: 1.  No acute fracture.  2. Moderate multilevel degenerative disc disease prominent at  L4-L5 and L5-S1 with associated facet joint arthropathy. Grade 1 anterolisthesis of L4.   Electronically Signed By: Larose Hires D.O. On: 02/25/2022 18:34  Hip Imaging: Hip-R DG 2-3 views: Results for orders placed during the hospital encounter of 05/31/19 DG HIP UNILAT WITH PELVIS 2-3 VIEWS RIGHT  Narrative CLINICAL DATA:  Bilateral hip pain, no known injury, initial encounter  EXAM: DG HIP (WITH OR WITHOUT PELVIS) 2V RIGHT  COMPARISON:  05/20/2013  FINDINGS: Degenerative changes of the right hip joint are noted with remodeling of the femoral head. No fracture or dislocation is seen. The overall appearance is similar to that seen on prior exam.  IMPRESSION: Degenerative changes of the right hip joint.   Electronically Signed By: Alcide Clever M.D. On: 05/31/2019 15:56  Hip-L DG 2-3 views: Results for orders placed during the hospital encounter of 02/25/22 DG Hip Unilat W or Wo Pelvis 2-3 Views Left  Narrative CLINICAL DATA:  Left leg and hip pain.  EXAM:  DG HIP (WITH OR WITHOUT PELVIS) 2-3V LEFT  COMPARISON:  Radiographs dated May 31, 2019  FINDINGS: There is a mildly displaced fracture of the left inferior pubic ramus. Prior internal fixation of the left femoral neck fracture with mild-to-moderate hip arthropathy. Sacroiliac joints and pubic symphysis appear maintained. Moderate right hip osteoarthritis.  IMPRESSION: Mildly displaced fracture of the left inferior pubic ramus.   Electronically Signed By: Larose Hires D.O. On: 02/25/2022 18:32  Complexity Note: Imaging results reviewed.                         ROS  Cardiovascular: No reported cardiovascular signs or symptoms such as High blood pressure, coronary artery disease, abnormal heart rate or rhythm, heart attack, blood thinner therapy or heart weakness and/or failure Pulmonary or Respiratory: Smoking Neurological: No reported neurological signs or symptoms such as seizures, abnormal skin  sensations, urinary and/or fecal incontinence, being born with an abnormal open spine and/or a tethered spinal cord Psychological-Psychiatric: No reported psychological or psychiatric signs or symptoms such as difficulty sleeping, anxiety, depression, delusions or hallucinations (schizophrenial), mood swings (bipolar disorders) or suicidal ideations or attempts Gastrointestinal: No reported gastrointestinal signs or symptoms such as vomiting or evacuating blood, reflux, heartburn, alternating episodes of diarrhea and constipation, inflamed or scarred liver, or pancreas or irrregular and/or infrequent bowel movements Genitourinary: No reported renal or genitourinary signs or symptoms such as difficulty voiding or producing urine, peeing blood, non-functioning kidney, kidney stones, difficulty emptying the bladder, difficulty controlling the flow of urine, or chronic kidney disease Hematological: No reported hematological signs or symptoms such as prolonged bleeding, low or poor functioning platelets, bruising or bleeding easily, hereditary bleeding problems, low energy levels due to low hemoglobin or being anemic Endocrine: No reported endocrine signs or symptoms such as high or low blood sugar, rapid heart rate due to high thyroid levels, obesity or weight gain due to slow thyroid or thyroid disease Rheumatologic: Joint aches and or swelling due to excess weight (Osteoarthritis) Musculoskeletal: Negative for myasthenia gravis, muscular dystrophy, multiple sclerosis or malignant hyperthermia Work History: Disabled  Allergies  Ms. Shone is allergic to erythromycin.  Laboratory Chemistry Profile   Renal No results found for: "BUN", "CREATININE", "LABCREA", "BCR", "GFR", "GFRAA", "GFRNONAA", "SPECGRAV", "PHUR", "PROTEINUR"   Electrolytes No results found for: "NA", "K", "CL", "CALCIUM", "MG", "PHOS"   Hepatic No results found for: "AST", "ALT", "ALBUMIN", "ALKPHOS", "AMYLASE", "LIPASE", "AMMONIA"    ID Lab Results  Component Value Date   SARSCOV2NAA NEGATIVE 06/28/2019     Bone No results found for: "VD25OH", "VD125OH2TOT", "[id number]", "[id number]", "25OHVITD1", "25OHVITD2", "25OHVITD3", "TESTOFREE", "TESTOSTERONE"   Endocrine No results found for: "GLUCOSE", "GLUCOSEU", "HGBA1C", "TSH", "FREET4", "TESTOFREE", "TESTOSTERONE", "SHBG", "ESTRADIOL", "ESTRADIOLPCT", "ESTRADIOLFRE", "LABPREG", "ACTH", "CRTSLPL", "UCORFRPERLTR", "UCORFRPERDAY", "CORTISOLBASE"   Neuropathy No results found for: "VITAMINB12", "FOLATE", "HGBA1C", "HIV"   CNS No results found for: "COLORCSF", "APPEARCSF", "RBCCOUNTCSF", "WBCCSF", "POLYSCSF", "LYMPHSCSF", "EOSCSF", "PROTEINCSF", "GLUCCSF", "JCVIRUS", "CSFOLI", "IGGCSF", "LABACHR", "ACETBL"   Inflammation (CRP: Acute  ESR: Chronic) No results found for: "CRP", "ESRSEDRATE", "LATICACIDVEN"   Rheumatology No results found for: "RF", "ANA", "LABURIC", "URICUR", "LYMEIGGIGMAB", "LYMEABIGMQN", "HLAB27"   Coagulation No results found for: "INR", "LABPROT", "APTT", "PLT", "DDIMER", "LABHEMA", "VITAMINK1", "AT3"   Cardiovascular No results found for: "BNP", "CKTOTAL", "CKMB", "TROPONINI", "HGB", "HCT", "LABVMA", "EPIRU", "[id number]", "NOREPRU", "NOREPI24HUR", "DOPARU", "DOPAM24HRUR"   Screening Lab Results  Component Value Date   SARSCOV2NAA NEGATIVE 06/28/2019     Cancer No results found for: "CEA", "CA125", "LABCA2"  Allergens No results found for: "ALMOND", "APPLE", "ASPARAGUS", "AVOCADO", "BANANA", "BARLEY", "BASIL", "BAYLEAF", "GREENBEAN", "LIMABEAN", "WHITEBEAN", "BEEFIGE", "REDBEET", "BLUEBERRY", "BROCCOLI", "CABBAGE", "MELON", "CARROT", "CASEIN", "CASHEWNUT", "CAULIFLOWER", "CELERY"     Note: No results found under the [employer] electronic medical record  [facility]  Drug: Ms. Zerger  reports no history of drug use. Alcohol:  reports current alcohol use of about 14.0 standard drinks of alcohol per week. Tobacco:  reports that she has been  smoking cigarettes. She has never used smokeless tobacco. Medical:  has no past medical history on file. Family: family history is not on file.  Past Surgical History:  Procedure Laterality Date   COLONOSCOPY WITH PROPOFOL N/A 07/03/2019   Procedure: COLONOSCOPY WITH PROPOFOL;  Surgeon: Wyline Mood, MD;  Location: [facility] ENDOSCOPY;  Service: Gastroenterology;  Laterality: N/A;   FEMUR FRACTURE SURGERY Left 2018   TUBAL LIGATION  1986   Active Ambulatory Problems    Diagnosis Date Noted   Personal history of colonic polyps    Polyp of colon    Allergy 06/12/2023   Closed displaced intertrochanteric fracture of left femur (HCC) 06/12/2023   Degenerative scoliosis in adult patient 03/12/2015   Lumbosacral facet joint arthritis 03/12/2015   Osteoarthritis of hip (Right) 03/12/2015   Radiculopathy of lumbar region 03/12/2015   Sacroiliac pain 01/27/2017   Spondylolisthesis of lumbar region 03/12/2015   Tobacco use disorder 08/31/2017   Chronic pain syndrome 06/12/2023   Pharmacologic therapy 06/12/2023   Disorder of skeletal system 06/12/2023   Problems influencing health status 06/12/2023   Long term current use of non-steroidal anti-inflammatories (NSAID) 06/12/2023   DDD (degenerative disc disease), lumbar 06/12/2023   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 In no apparent acute distress Vitals:   06/12/23 1113  BP: 121/69  Pulse:  79  Resp: 16  Temp: (!) 97.3 F (36.3 C)  SpO2: 99%  Weight: 120 lb (54.4 kg)  Height: 5\' 5"  (1.651 m)   BMI Assessment: Estimated body mass index is 19.97 kg/m as calculated from the following:   Height as of this encounter: 5\' 5"  (1.651 m).   Weight as of this encounter: 120 lb (54.4 kg).  BMI interpretation table: BMI level Category Range association with higher incidence of chronic pain  <18 kg/m2 Underweight   18.5-24.9 kg/m2 Ideal body weight   25-29.9 kg/m2 Overweight Increased incidence by 20%  30-34.9 kg/m2 Obese (Class I) Increased incidence by 68%  35-39.9 kg/m2 Severe obesity (Class II) Increased incidence by 136%  >40 kg/m2 Extreme obesity (Class III) Increased incidence by 254%   Patient's current BMI Ideal Body weight  Body mass index is 19.97 kg/m. Ideal body weight: 57 kg (125 lb 10.6 oz)   BMI Readings from Last 4 Encounters:  06/12/23 19.97 kg/m  11/12/22 19.97 kg/m  02/25/22 16.84 kg/m  07/03/19 16.82 kg/m   Wt Readings from Last 4 Encounters:  06/12/23 120 lb (54.4 kg)  11/12/22 120 lb (54.4 kg)  02/25/22 98 lb 1.7 oz (44.5 kg)  07/03/19 98 lb (44.5 kg)    Psych/Mental status: Alert, oriented x 3 (person, place, & time)       Eyes: PERLA Respiratory: No evidence of acute respiratory distress  Assessment  Primary Diagnosis & Pertinent Problem List: The primary encounter diagnosis was Chronic pain syndrome. 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  Visit Diagnosis (New problems to examiner): 1. Chronic pain  syndrome   2. Pharmacologic therapy   3. Disorder of skeletal system   4. Problems influencing health status   5. Long term current use of non-steroidal anti-inflammatories (NSAID)   6. Chronic low back pain (1ry area of Pain) (Bilateral) w/o sciatica   7. Chronic hip pain (2ry area of Pain) (Bilateral)   8. Chronic lower extremity pain (3ry area of Pain) (Right)   9. 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 Lumbosacral facet joint arthritis   11. Lumbar facet joint pain    Plan of Care (Initial workup plan)  Note: Ms. Zuno was reminded that as per protocol, today's visit has been an evaluation only. We have not taken over the patient's controlled substance management.  Problem-specific plan: No problem-specific Assessment & Plan notes found for this encounter.  Lab Orders         Compliance Drug Analysis, Ur         Comp. Metabolic Panel (12)         Magnesium         Vitamin B12         Sedimentation rate         25-Hydroxy vitamin D Lcms D2+D3         C-reactive protein     Imaging Orders         DG Lumbar Spine Complete W/Bend         DG HIP UNILAT W OR W/O PELVIS 2-3 VIEWS RIGHT         DG HIP UNILAT W OR W/O PELVIS 2-3 VIEWS LEFT     Referral Orders  No referral(s) requested today   Procedure Orders    No procedure(s) ordered today   Pharmacotherapy (current): Medications ordered:  No orders of the defined types were placed in this encounter.  Medications administered during this visit: [name] had no medications administered during this visit.   Analgesic Pharmacotherapy:  Opioid Analgesics: For patients currently taking or requesting to take opioid analgesics, in accordance with [facility] Guidelines, we will assess their risks and indications for the use of these substances. After completing our evaluation, we may offer recommendations, but we no longer take patients for medication management. The prescribing physician  will ultimately decide, based on his/her training and level of comfort whether to adopt any of the recommendations, including whether or not to prescribe such medicines.  Membrane stabilizer: To be determined at a later time  Muscle relaxant: To be determined at a later time  NSAID: To be determined at a later time  Other analgesic(s): To be determined at a later time   Interventional management options: Ms.  Aymond was informed that there is no guarantee that she would be a candidate for interventional therapies. The decision will be based on the results of diagnostic studies, as well as Ms. Berte risk profile.  Procedure(s) under consideration:  Pending results of ordered studies      Interventional Therapies  Risk Factors  Considerations  Medical Comorbidities:     Planned  Pending:      Under consideration:   Diagnostic bilateral lumbar facet MBB #1  Diagnostic right L4-5 LESI #1  Diagnostic bilateral IA hip injection #1    Completed:   None at this time   Therapeutic  Palliative (PRN) options:   None established   Completed by other providers:   Diagnostic/therapeutic bilateral SI joint injection x1 (02/15/2017) by Mila Palmer, MD ([facility])       Provider-requested follow-up: Return for ( ), Eval-day (M,W), (F2F), 2nd Visit, for review of ordered tests.  Future Appointments  Date Time Provider Department Center  07/12/2023 10:00 AM Delano Metz, MD [facility] None     Duration of encounter: 57 minutes.  Total time on encounter, as per AMA guidelines included both the face-to-face and non-face-to-face time personally spent by the physician and/or other qualified health care professional(s) on the day of the encounter (includes time in activities that require the physician or other qualified health care professional and does not include time in activities normally performed by clinical staff). Physician's time may include the following activities  when performed: Preparing to see the patient (e.g., pre-charting review of records, searching for previously ordered imaging, lab work, and nerve conduction tests) Review of prior analgesic pharmacotherapies. Reviewing PMP Interpreting ordered tests (e.g., lab work, imaging, nerve conduction tests) Performing post-procedure evaluations, including interpretation of diagnostic procedures Obtaining and/or reviewing separately obtained history Performing a medically appropriate examination and/or evaluation Counseling and educating the patient/family/caregiver Ordering medications, tests, or procedures Referring and communicating with other health care professionals (when not separately reported) Documenting clinical information in the electronic or other health record Independently interpreting results (not separately reported) and communicating results to the patient/ family/caregiver Care coordination (not separately reported)  Note by: Oswaldo Done, MD (TTS technology used. I apologize for any typographical errors that were not detected and corrected.) Date: 06/12/2023; Time: 12:22 PM

## 2023-06-12 NOTE — Patient Instructions (Signed)

## 2023-07-11 NOTE — Progress Notes (Unsigned)
PROVIDER NOTE: Information contained herein reflects review and annotations entered in association with encounter. Interpretation of such information and data should be left to medically-trained personnel. Information provided to patient can be located elsewhere in the medical record under "Patient Instructions". Document created using STT-dictation technology, any transcriptional errors that may result from process are unintentional.    Patient: Cassidy Erickson  Service Category: E/M  Provider: Oswaldo Done, MD  DOB: 03/12/1961  DOS: 07/12/2023  Referring Provider: Judeen Hammans, MD  MRN: [id number]  Specialty: Interventional Pain Management  PCP: Judeen Hammans, MD  Type: Established Patient  Setting: Ambulatory outpatient    Location: Office  Delivery: Face-to-face     Primary Reason(s) for Visit: Encounter for evaluation before starting new chronic pain management plan of care (Level of risk: moderate) CC: No chief complaint on file.  HPI  Cassidy Erickson is a 62 y.o. year old, female patient, who comes today for a follow-up evaluation to review the test results and decide on a treatment plan. 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 Her primarily concern today is the No chief complaint on file.  Pain Assessment: Location:     Radiating:   Onset:   Duration:   Quality:   Severity:  /10 (subjective, self-reported pain score)  Effect on ADL:   Timing:   Modifying factors:   BP:    HR:    [name] comes in today for a follow-up visit after her initial evaluation on 06/12/2023. Today we went over the results of her tests. These were explained in "Layman's terms". During today's appointment we went over my diagnostic impression, as well as the proposed treatment plan.  ***  Patient presented with interventional treatment options. Cassidy Erickson was informed that I will not be providing medication management. Pharmacotherapy evaluation including recommendations may be offered, if specifically requested.   Controlled Substance Pharmacotherapy Assessment REMS (Risk Evaluation and Mitigation Strategy)  Opioid Analgesic: None MME/day: 0 mg/day  Pill Count: None expected due to no prior prescriptions written by our practice. No notes on file Pharmacokinetics: Liberation and absorption (onset of action): WNL Distribution (time to peak effect): WNL Metabolism and excretion (duration of action): WNL         Pharmacodynamics: Desired effects: Analgesia: Cassidy Erickson reports >50% benefit. Functional ability: Patient reports that medication allows her to accomplish basic ADLs Clinically meaningful improvement in function (CMIF): Sustained CMIF goals met Perceived effectiveness: Described as relatively effective, allowing for increase in activities of daily living (ADL) Undesirable effects: Side-effects or Adverse reactions: None reported Monitoring: [place] PMP: PDMP reviewed during this encounter. Online review of the past 22-month period previously conducted. Not applicable at this point since we have not taken over the patient's medication  management yet. List of other Serum/Urine Drug Screening Test(s):  No  results found for: "AMPHSCRSER", "BARBSCRSER", "BENZOSCRSER", "COCAINSCRSER", "COCAINSCRNUR", "PCPSCRSER", "THCSCRSER", "THCU", "CANNABQUANT", "OPIATESCRSER", "OXYSCRSER", "PROPOXSCRSER", "ETH", "CBDTHCR", "D8THCCBX", "D9THCCBX" List of all UDS test(s) done:  Lab Results  Component Value Date   SUMMARY Note 06/12/2023   Last UDS on record: Summary  Date Value Ref Range Status  06/12/2023 Note  Final    Comment:    ==================================================================== Compliance Drug Analysis, Ur ==================================================================== Test                             Result       Flag       Units  Drug Present and Declared for Prescription Verification   Naproxen                       PRESENT      EXPECTED  Drug Present not Declared for Prescription Verification   Benzoylecgonine                2164         UNEXPECTED ng/mg creat    Benzoylecgonine is a metabolite of cocaine; its presence indicates    use of this drug.  Source is most commonly illicit, but cocaine is    present in some topical anesthetic solutions.  Drug Absent but Declared for Prescription Verification   Bupropion                      Not Detected UNEXPECTED   Ibuprofen                      Not Detected UNEXPECTED    Ibuprofen, as indicated in the declared medication list, is not    always detected even when used as directed.  ==================================================================== Test                      Result    Flag   Units      Ref Range   Creatinine              97               mg/dL      >=52 ==================================================================== Declared Medications:  The flagging and interpretation on this report are based on the  following declared medications.  Unexpected results may arise from  inaccuracies in the declared medications.   **Note: The  testing scope of this panel includes these medications:   Bupropion (Wellbutrin SR)  Naproxen (Naprosyn)   **Note: The testing scope of this panel does not include small to  moderate amounts of these reported medications:   Ibuprofen (Advil)   **Note: The testing scope of this panel does not include the  following reported medications:   Omeprazole (Prilosec)  Vitamin D3 ==================================================================== For clinical consultation, please call [phone]. ====================================================================    UDS interpretation: No unexpected findings.          Medication Assessment Form: Not applicable. No opioids. Treatment compliance: Not applicable Risk Assessment Profile: Aberrant behavior: See initial evaluations. None observed or detected today Comorbid factors increasing risk of overdose: See initial evaluation. No additional risks detected today Opioid risk tool (ORT):     06/12/2023   11:20 AM  Opioid Risk   Alcohol 0  Illegal Drugs 0  Rx Drugs 0  Alcohol 0  Illegal Drugs 0  Rx Drugs 0  Age between 16-45 years  0  Psychological Disease 0  Depression 0  Opioid Risk Tool Scoring 0  Opioid Risk Interpretation Low Risk    ORT Scoring interpretation table:  Score <3 = Low Risk for SUD  Score between 4-7 = Moderate Risk for SUD  Score >8 = High Risk for Opioid Abuse   Risk of substance use disorder (SUD): Low  Risk Mitigation Strategies:  Patient opioid safety counseling: No controlled substances prescribed. Patient-Prescriber Agreement (PPA): No agreement signed.  Controlled substance notification to other providers: None required. No opioid therapy.  Pharmacologic Plan: Non-opioid analgesic therapy offered. Interventional alternatives discussed.             Laboratory Chemistry Profile   Renal Lab Results  Component Value Date   BUN 7 (L) 06/12/2023   CREATININE 0.65 06/12/2023   BCR 11 (L)  06/12/2023     Electrolytes Lab Results  Component Value Date   NA 140 06/12/2023   K 4.7 06/12/2023   CL 100 06/12/2023   CALCIUM 9.9 06/12/2023   MG 1.8 06/12/2023     Hepatic Lab Results  Component Value Date   AST 34 06/12/2023   ALBUMIN 4.7 06/12/2023   ALKPHOS 116 06/12/2023     ID Lab Results  Component Value Date   SARSCOV2NAA NEGATIVE 06/28/2019     Bone Lab Results  Component Value Date   25OHVITD1 34 06/12/2023   25OHVITD2 5.8 06/12/2023   25OHVITD3 28 06/12/2023     Endocrine Lab Results  Component Value Date   GLUCOSE 99 06/12/2023     Neuropathy Lab Results  Component Value Date   VITAMINB12 680 06/12/2023     CNS No results found for: "COLORCSF", "APPEARCSF", "RBCCOUNTCSF", "WBCCSF", "POLYSCSF", "LYMPHSCSF", "EOSCSF", "PROTEINCSF", "GLUCCSF", "JCVIRUS", "CSFOLI", "IGGCSF", "LABACHR", "ACETBL"   Inflammation (CRP: Acute  ESR: Chronic) Lab Results  Component Value Date   CRP 3 06/12/2023   ESRSEDRATE 3 06/12/2023     Rheumatology No results found for: "RF", "ANA", "LABURIC", "URICUR", "LYMEIGGIGMAB", "LYMEABIGMQN", "HLAB27"   Coagulation No results found for: "INR", "LABPROT", "APTT", "PLT", "DDIMER", "LABHEMA", "VITAMINK1", "AT3"   Cardiovascular No results found for: "BNP", "CKTOTAL", "CKMB", "TROPONINI", "HGB", "HCT", "LABVMA", "EPIRU", "EPINEPH24HUR", "NOREPRU", "NOREPI24HUR", "DOPARU", "DOPAM24HRUR"   Screening Lab Results  Component Value Date   SARSCOV2NAA NEGATIVE 06/28/2019     Cancer No results found for: "CEA", "CA125", "LABCA2"   Allergens No results found for: "ALMOND", "APPLE", "ASPARAGUS", "AVOCADO", "BANANA", "BARLEY", "BASIL", "BAYLEAF", "GREENBEAN", "LIMABEAN", "WHITEBEAN", "BEEFIGE", "REDBEET", "BLUEBERRY", "BROCCOLI", "CABBAGE", "MELON", "CARROT", "CASEIN", "CASHEWNUT", "CAULIFLOWER", "CELERY"     Note: Lab results reviewed.  Recent Diagnostic Imaging Review  Cervical Imaging: Cervical MR wo contrast: No  results found for this or any previous visit.  Cervical MR wo contrast: No valid procedures specified. Cervical CT wo contrast: No results found for this or any previous visit.  Cervical DG Bending/F/E views: No results found for this or any previous visit.   Shoulder Imaging: Shoulder-R MR wo contrast: No results found for this or any previous visit.  Shoulder-L MR wo contrast: No results found for this or any previous visit.  Shoulder-R DG: No results found for this or any previous visit.  Shoulder-L DG: No results found for this or any previous visit.   Thoracic Imaging: Thoracic MR wo contrast: No results found for this or any previous visit.  Thoracic MR wo contrast: No valid procedures specified. Thoracic CT wo contrast: No results found for this or any previous visit.  Thoracic DG 4 views: No results found  for this or any previous visit.  Thoracic DG w/swimmers view: No results found for this or any previous visit.   Lumbosacral Imaging: Lumbar MR wo contrast: No results found for this or any previous visit.  Lumbar MR wo contrast: No valid procedures specified. Lumbar CT wo contrast: No results found for this or any previous visit.  Lumbar DG Bending views: Results for orders placed during the hospital encounter of 06/12/23  DG Lumbar Spine Complete W/Bend  Narrative CLINICAL DATA:  Low back pain  EXAM: LUMBAR SPINE - COMPLETE WITH BENDING VIEWS  COMPARISON:  February 25, 2022  FINDINGS: There are five non-rib bearing lumbar-type vertebral bodies. There is grade 1 anterolisthesis of L4-5. It measures approximately 8 mm in neutral and flexion views and is approximately 6 mm with extension. Mild vertebral body height loss of T12, age indeterminate. Mild intervertebral disc space height loss at L4-5. Lower lumbar facet arthropathy. Sacrum is obscured by overlapping bowel contents. Status post LEFT femoral internal fixation. Remote LEFT ischium  fracture.  IMPRESSION: 1. Grade 1 anterolisthesis of L4-5 with approximately 2 mm of motion with extension. 2. Mild vertebral body height loss of T12, age indeterminate. Recommend correlation with point tenderness.   Electronically Signed By: Meda Klinefelter M.D. On: 06/18/2023 18:22         Sacroiliac Joint Imaging: Sacroiliac Joint DG: No results found for this or any previous visit.   Hip Imaging: Hip-R MR wo contrast: No results found for this or any previous visit.  Hip-L MR wo contrast: No results found for this or any previous visit.  Hip-R CT wo contrast: No results found for this or any previous visit.  Hip-L CT wo contrast: No results found for this or any previous visit.  Hip-R DG 2-3 views: Results for orders placed during the hospital encounter of 05/31/19  DG HIP UNILAT WITH PELVIS 2-3 VIEWS RIGHT  Narrative CLINICAL DATA:  Bilateral hip pain, no known injury, initial encounter  EXAM: DG HIP (WITH OR WITHOUT PELVIS) 2V RIGHT  COMPARISON:  05/20/2013  FINDINGS: Degenerative changes of the right hip joint are noted with remodeling of the femoral head. No fracture or dislocation is seen. The overall appearance is similar to that seen on prior exam.  IMPRESSION: Degenerative changes of the right hip joint.   Electronically Signed By: Alcide Clever M.D. On: 05/31/2019 15:56  Hip-L DG 2-3 views: Results for orders placed during the hospital encounter of 02/25/22  DG Hip Unilat W or Wo Pelvis 2-3 Views Left  Narrative CLINICAL DATA:  Left leg and hip pain.  EXAM: DG HIP (WITH OR WITHOUT PELVIS) 2-3V LEFT  COMPARISON:  Radiographs dated May 31, 2019  FINDINGS: There is a mildly displaced fracture of the left inferior pubic ramus. Prior internal fixation of the left femoral neck fracture with mild-to-moderate hip arthropathy. Sacroiliac joints and pubic symphysis appear maintained. Moderate right hip osteoarthritis.  IMPRESSION: Mildly  displaced fracture of the left inferior pubic ramus.   Electronically Signed By: Larose Hires D.O. On: 02/25/2022 18:32  Hip-B DG Bilateral: No results found for this or any previous visit.  Hip-B DG Bilateral (5V): Results for orders placed during the hospital encounter of 06/12/23  DG HIPS BILAT WITH PELVIS MIN 5 VIEWS  Narrative CLINICAL DATA:  arthralgia  EXAM: DG HIP (WITH OR WITHOUT PELVIS) 5+V BILAT  COMPARISON:  February 25, 2022  FINDINGS: Osteopenia. Sequela prior LEFT ischial fracture. Status post LEFT femoral screw placement. Orthopedic hardware is intact  and without periprosthetic fracture or lucency. Moderate to severe degenerative changes of bilateral hips with joint space narrowing and rim osteophyte formation. No acute fracture or dislocation.  IMPRESSION: Moderate to severe degenerative changes of bilateral hips.   Electronically Signed By: Meda Klinefelter M.D. On: 06/18/2023 18:23   Knee Imaging: Knee-R MR wo contrast: No results found for this or any previous visit.  Knee-L MR wo contrast: No results found for this or any previous visit.  Knee-R CT wo contrast: No results found for this or any previous visit.  Knee-L CT wo contrast: No results found for this or any previous visit.  Knee-R DG 4 views: No results found for this or any previous visit.  Knee-L DG 4 views: No results found for this or any previous visit.   Ankle Imaging: Ankle-R DG Complete: No results found for this or any previous visit.  Ankle-L DG Complete: No results found for this or any previous visit.   Foot Imaging: Foot-R DG Complete: No results found for this or any previous visit.  Foot-L DG Complete: No results found for this or any previous visit.   Elbow Imaging: Elbow-R DG Complete: No results found for this or any previous visit.  Elbow-L DG Complete: No results found for this or any previous visit.   Wrist Imaging: Wrist-R DG Complete: No results  found for this or any previous visit.  Wrist-L DG Complete: No results found for this or any previous visit.   Hand Imaging: Hand-R DG Complete: No results found for this or any previous visit.  Hand-L DG Complete: No results found for this or any previous visit.   Complexity Note: Imaging results reviewed.                         Meds   Current Outpatient Medications:    buPROPion (WELLBUTRIN SR) 150 MG 12 hr tablet, , Disp: , Rfl:    cholecalciferol (VITAMIN D3) 25 MCG (1000 UNIT) tablet, Take 1,000 Units by mouth daily., Disp: , Rfl:    ibuprofen (ADVIL) 600 MG tablet, , Disp: , Rfl:    naproxen (NAPROSYN) 500 MG tablet, , Disp: , Rfl:    naproxen sodium (ALEVE) 220 MG tablet, Take by mouth., Disp: , Rfl:    omeprazole (PRILOSEC) 20 MG capsule, Take 20 mg by mouth daily., Disp: , Rfl:   ROS  Constitutional: Denies any fever or chills Gastrointestinal: No reported hemesis, hematochezia, vomiting, or acute GI distress Musculoskeletal: Denies any acute onset joint swelling, redness, loss of ROM, or weakness Neurological: No reported episodes of acute onset apraxia, aphasia, dysarthria, agnosia, amnesia, paralysis, loss of coordination, or loss of consciousness  Allergies  Cassidy Erickson is allergic to erythromycin.  PFSH  Drug: Cassidy Erickson  reports no history of drug use. Alcohol:  reports current alcohol use of about 14.0 standard drinks of alcohol per week. Tobacco:  reports that she has been smoking cigarettes. She has never used smokeless tobacco. Medical:  has no past medical history on file. Surgical: Cassidy Erickson  has a past surgical history that includes Femur fracture surgery (Left, 2018); Tubal ligation (1986); and Colonoscopy with propofol (N/A, 07/03/2019). Family: family history is not on file.  Constitutional Exam  General appearance: Well nourished, well developed, and well hydrated. In no apparent acute distress There were no vitals filed for this visit. BMI  Assessment: Estimated body mass index is 19.97 kg/m as calculated from the following:   Height  as of 06/12/23: 5\' 5"  (1.651 m).   Weight as of 06/12/23: 120 lb (54.4 kg).  BMI interpretation table: BMI level Category Range association with higher incidence of chronic pain  <18 kg/m2 Underweight   18.5-24.9 kg/m2 Ideal body weight   25-29.9 kg/m2 Overweight Increased incidence by 20%  30-34.9 kg/m2 Obese (Class I) Increased incidence by 68%  35-39.9 kg/m2 Severe obesity (Class II) Increased incidence by 136%  >40 kg/m2 Extreme obesity (Class III) Increased incidence by 254%   Patient's current BMI Ideal Body weight  There is no height or weight on file to calculate BMI. Patient weight not recorded   BMI Readings from Last 4 Encounters:  06/12/23 19.97 kg/m  11/12/22 19.97 kg/m  02/25/22 16.84 kg/m  07/03/19 16.82 kg/m   Wt Readings from Last 4 Encounters:  06/12/23 120 lb (54.4 kg)  11/12/22 120 lb (54.4 kg)  02/25/22 98 lb 1.7 oz (44.5 kg)  07/03/19 98 lb (44.5 kg)    Psych/Mental status: Alert, oriented x 3 (person, place, & time)       Eyes: PERLA Respiratory: No evidence of acute respiratory distress  Assessment & Plan  Primary Diagnosis & Pertinent Problem List: {There were no encounter diagnoses. (Refresh or delete this SmartLink)}  Visit Diagnosis: No diagnosis found. Problems updated and reviewed during this visit: No problems updated.  Plan of Care  Pharmacotherapy (Medications Ordered): No orders of the defined types were placed in this encounter.  Procedure Orders    No procedure(s) ordered today   Lab Orders  No laboratory test(s) ordered today   Imaging Orders  No imaging studies ordered today   Referral Orders  No referral(s) requested today    Pharmacological management:  Opioid Analgesics: I will not be prescribing any opioids at this time Membrane stabilizer: I will not be prescribing any at this time Muscle relaxant: I will not be  prescribing any at this time NSAID: I will not be prescribing any at this time Other analgesic(s): I will not be prescribing any at this time      Interventional Therapies  Risk Factors  Considerations  Medical Comorbidities:     Planned  Pending:      Under consideration:   Diagnostic bilateral lumbar facet MBB #1  Diagnostic right L4-5 LESI #1  Diagnostic bilateral IA hip injection #1    Completed:   None at this time   Therapeutic  Palliative (PRN) options:   None established   Completed by other providers:   Diagnostic/therapeutic bilateral SI joint injection x1 (02/15/2017) by Mila Palmer, MD ([facility])         Provider-requested follow-up: No follow-ups on file. Recent Visits Date Type Provider Dept  06/12/23 Office Visit Delano Metz, MD [facility]  Showing recent visits within past 90 days and meeting all other requirements Future Appointments Date Type Provider Dept  07/12/23 Appointment Delano Metz, MD [facility]  Showing future appointments within next 90 days and meeting all other requirements   Primary Care Physician: Judeen Hammans, MD  Duration of encounter: *** minutes.  Total time on encounter, as per AMA guidelines included both the face-to-face and non-face-to-face time personally spent by the physician and/or other qualified health care professional(s) on the day of the encounter (includes time in activities that require the physician or other qualified health care professional and does not include time in activities normally performed by clinical staff). Physician's time may include the following activities when performed: Preparing to see  the patient (e.g., pre-charting review of records, searching for previously ordered imaging, lab work, and nerve conduction tests) Review of prior analgesic pharmacotherapies. Reviewing PMP Interpreting ordered tests (e.g., lab work, imaging, nerve conduction  tests) Performing post-procedure evaluations, including interpretation of diagnostic procedures Obtaining and/or reviewing separately obtained history Performing a medically appropriate examination and/or evaluation Counseling and educating the patient/family/caregiver Ordering medications, tests, or procedures Referring and communicating with other health care professionals (when not separately reported) Documenting clinical information in the electronic or other health record Independently interpreting results (not separately reported) and communicating results to the patient/ family/caregiver Care coordination (not separately reported)  Note by: Oswaldo Done, MD (TTS technology used. I apologize for any typographical errors that were not detected and corrected.) Date: 07/12/2023; Time: 7:45 AM

## 2023-07-12 ENCOUNTER — Encounter: Payer: Self-pay | Admitting: Pain Medicine

## 2023-07-12 ENCOUNTER — Ambulatory Visit: Payer: Medicare Other | Attending: Pain Medicine | Admitting: Pain Medicine

## 2023-07-12 VITALS — BP 123/64 | HR 73 | Temp 97.4°F | Resp 18 | Ht 65.0 in | Wt 122.0 lb

## 2023-07-12 DIAGNOSIS — M545 Low back pain, unspecified: Secondary | ICD-10-CM | POA: Insufficient documentation

## 2023-07-12 DIAGNOSIS — M5136 Other intervertebral disc degeneration, lumbar region: Secondary | ICD-10-CM | POA: Insufficient documentation

## 2023-07-12 DIAGNOSIS — M5459 Other low back pain: Secondary | ICD-10-CM | POA: Diagnosis present

## 2023-07-12 DIAGNOSIS — M4316 Spondylolisthesis, lumbar region: Secondary | ICD-10-CM | POA: Insufficient documentation

## 2023-07-12 DIAGNOSIS — G8929 Other chronic pain: Secondary | ICD-10-CM | POA: Diagnosis present

## 2023-07-12 DIAGNOSIS — M47816 Spondylosis without myelopathy or radiculopathy, lumbar region: Secondary | ICD-10-CM | POA: Diagnosis not present

## 2023-07-12 DIAGNOSIS — M47817 Spondylosis without myelopathy or radiculopathy, lumbosacral region: Secondary | ICD-10-CM | POA: Diagnosis not present

## 2023-07-12 NOTE — Progress Notes (Signed)
Safety precautions to be maintained throughout the outpatient stay will include: orient to surroundings, keep bed in low position, maintain call bell within reach at all times, provide assistance with transfer out of bed and ambulation.  

## 2023-07-12 NOTE — Patient Instructions (Signed)

## 2023-08-03 ENCOUNTER — Ambulatory Visit: Payer: Medicare HMO | Admitting: Podiatry

## 2023-08-03 ENCOUNTER — Ambulatory Visit: Payer: Medicare Other | Admitting: Pain Medicine

## 2023-08-07 ENCOUNTER — Emergency Department
Admission: EM | Admit: 2023-08-07 | Discharge: 2023-08-07 | Disposition: A | Discharge status: Home / Self Care | Payer: Medicare Other | Attending: Emergency Medicine | Admitting: Emergency Medicine

## 2023-08-07 ENCOUNTER — Ambulatory Visit: Payer: Medicare Other | Admitting: Podiatry

## 2023-08-07 ENCOUNTER — Emergency Department: Payer: Medicare Other

## 2023-08-07 ENCOUNTER — Other Ambulatory Visit: Payer: Self-pay

## 2023-08-07 DIAGNOSIS — R051 Acute cough: Secondary | ICD-10-CM | POA: Diagnosis not present

## 2023-08-07 DIAGNOSIS — R059 Cough, unspecified: Secondary | ICD-10-CM | POA: Diagnosis present

## 2023-08-07 DIAGNOSIS — J45909 Unspecified asthma, uncomplicated: Secondary | ICD-10-CM | POA: Diagnosis not present

## 2023-08-07 DIAGNOSIS — R0602 Shortness of breath: Secondary | ICD-10-CM | POA: Diagnosis not present

## 2023-08-07 LAB — BASIC METABOLIC PANEL
Anion gap: 11 (ref 5–15)
BUN: 11 mg/dL (ref 8–23)
CO2: 23 mmol/L (ref 22–32)
Calcium: 9.4 mg/dL (ref 8.9–10.3)
Chloride: 103 mmol/L (ref 98–111)
Creatinine, Ser: 0.59 mg/dL (ref 0.44–1.00)
GFR, Estimated: >60 mL/min (ref >60)
Glucose, Bld: 127 mg/dL — ABNORMAL HIGH (ref 70–99)
Potassium: 3.5 mmol/L (ref 3.5–5.1)
Sodium: 137 mmol/L (ref 135–145)

## 2023-08-07 LAB — CBC
HCT: 49 % — ABNORMAL HIGH (ref 36.0–46.0)
Hemoglobin: 16.5 g/dL — ABNORMAL HIGH (ref 12.0–15.0)
MCH: 32.4 pg (ref 26.0–34.0)
MCHC: 33.7 g/dL (ref 30.0–36.0)
MCV: 96.1 fL (ref 80.0–100.0)
Platelets: 261 10*3/uL (ref 150–400)
RBC: 5.1 MIL/uL (ref 3.87–5.11)
RDW: 12 % (ref 11.5–15.5)
WBC: 5.5 10*3/uL (ref 4.0–10.5)
nRBC: 0 % (ref 0.0–0.2)

## 2023-08-07 MED ORDER — PREDNISONE 20 MG PO TABS: 40.0000 mg | ORAL_TABLET | Freq: Every day | ORAL | 0 refills | Status: AC

## 2023-08-07 MED ORDER — PREDNISONE 20 MG PO TABS
40.0000 mg | ORAL_TABLET | Freq: Once | ORAL | Status: AC
  Administered 2023-08-07: 40 mg via ORAL
  Filled 2023-08-07: qty 2

## 2023-08-07 MED ORDER — IPRATROPIUM-ALBUTEROL 0.5-2.5 (3) MG/3ML IN SOLN
6.0000 mL | Freq: Once | RESPIRATORY_TRACT | Status: AC
  Administered 2023-08-07: 6 mL via RESPIRATORY_TRACT
  Filled 2023-08-07: qty 6

## 2023-08-07 MED ORDER — ALBUTEROL SULFATE HFA 108 (90 BASE) MCG/ACT IN AERS: 2.0000 | INHALATION_SPRAY | Freq: Four times a day (QID) | RESPIRATORY_TRACT | 2 refills | Status: DC | PRN

## 2023-08-07 NOTE — Discharge Instructions (Signed)
You were seen in the ER today for evaluation of your cough and shortness of breath.  I suspect you may have a viral illness that has flared your asthma.  I did send an albuterol inhaler as well as a steroid course to your pharmacy.  Please follow with your primary care doctor within a few days for reevaluation.  Return to the ER for new or worsening symptoms.

## 2023-08-07 NOTE — ED Provider Notes (Signed)
[facility] Provider Note    Event Date/Time   First MD Initiated Contact with Patient 08/07/23 1349     (approximate)   History   Shortness of Breath   HPI  Cassidy Erickson is a 62 year old female with history of childhood asthma, tobacco use recently quit presenting to the emergency department for evaluation of cough.  Patient reports that for the last several days she has had an ongoing cough and congestion.  Saw her primary care doctor who thought it could be related to her recently quitting smoking.  Today, her shortness of breath got worse, feels wheezy leading her to present to the ER.  No fevers or chills.  Nonproductive cough.  No chest pain.     Physical Exam   Triage Vital Signs: ED Triage Vitals  Encounter Vitals Group     BP 08/07/23 1053 (!) 143/90     Systolic BP Percentile --      Diastolic BP Percentile --      Pulse Rate 08/07/23 1053 98     Resp 08/07/23 1053 19     Temp 08/07/23 1053 97.7 F (36.5 C)     Temp Source 08/07/23 1053 Oral     SpO2 08/07/23 1053 94 %     Weight 08/07/23 1401 121 lb 14.6 oz (55.3 kg)     Height 08/07/23 1401 5\' 5"  (1.651 m)     Head Circumference --      Peak Flow --      Pain Score 08/07/23 1059 0     Pain Loc --      Pain Education --      Exclude from Growth Chart --     Most recent vital signs: Vitals:   08/07/23 1053 08/07/23 1501  BP: (!) 143/90 (!) 140/88  Pulse: 98 90  Resp: 19 18  Temp: 97.7 F (36.5 C) 98 F (36.7 C)  SpO2: 94% 95%     General: Awake, interactive  CV:  Regular rate, good peripheral perfusion.  Resp:  Sounds diminished with expiratory wheezing in multiple lung fields, mildly increased work of breathing Abd:  Soft, nondistended.  Neuro:  Symmetric facial movement, fluid speech   ED Results / Procedures / Treatments   Labs (all labs ordered are listed, but only abnormal results are displayed) Labs Reviewed  BASIC METABOLIC PANEL - Abnormal; Notable for  the following components:      Result Value   Glucose, Bld 127 (*)    All other components within normal limits  CBC - Abnormal; Notable for the following components:   Hemoglobin 16.5 (*)    HCT 49.0 (*)    All other components within normal limits     EKG EKG independently reviewed interpreted by myself (ER attending) demonstrates:  EKG demonstrates normal sinus rhythm at a rate of 85, PR 142, QRS 92, QTc 456, incomplete right bundle block noted  RADIOLOGY Imaging independently reviewed and interpreted by myself demonstrates:  CXR without focal consolidation  PROCEDURES:  Critical Care performed: No  Procedures   MEDICATIONS ORDERED IN ED: Medications  ipratropium-albuterol (DUONEB) 0.5-2.5 (3) MG/3ML nebulizer solution 6 mL (6 mLs Nebulization Given 08/07/23 1427)  predniSONE (DELTASONE) tablet 40 mg (40 mg Oral Given 08/07/23 1427)     IMPRESSION / MDM / ASSESSMENT AND PLAN / ED COURSE  I reviewed the triage vital signs and the nursing notes.  Differential diagnosis includes, but is not limited to, pneumonia, pneumothorax, viral illness with  asthma/undiagnosed COPD flare, much lower suspicion ACS, PE based on clinical history  Patient's presentation is most consistent with acute presentation with potential threat to life or bodily function.  62 year old female presenting to the emergency department for evaluation of shortness of breath last in triage without significant derangement.  X-Ashayla Subia without pneumonia.  Patient does have wheezing and some increased work of breathing on evaluation.  Will trial DuoNebs, steroids, and reevaluate.  Clinical Course as of 08/07/23 1505  Mon Aug 07, 2023  1502 Patient reassessed.  Reports significant improvement in her breathing after breathing treatments.  Has rare expiratory wheeze on auscultation, but much improved air movement.  Remains with appropriate oxygen saturations on room air.  Suspect likely asthma flare in the setting of  viral illness. Is comfortable with discharge home.  Will DC with albuterol inhaler and steroid course.  Strict return precautions provided. [NR]    Clinical Course User Index [NR] Trinna Post, MD     FINAL CLINICAL IMPRESSION(S) / ED DIAGNOSES   Final diagnoses:  Acute cough  SOB (shortness of breath)     Rx / DC Orders   ED Discharge Orders          Ordered    albuterol (VENTOLIN HFA) 108 (90 Base) MCG/ACT inhaler  Every 6 hours PRN        08/07/23 1504    predniSONE (DELTASONE) 20 MG tablet  Daily with breakfast        08/07/23 1504             Note:  This document was prepared using Dragon voice recognition software and may include unintentional dictation errors.   Trinna Post, MD 08/07/23 [phone]

## 2023-08-07 NOTE — ED Triage Notes (Signed)
Says short of breath , coughing all night.  Appeared sob when she walked in.  Got in wheelchair and improved some.  Able to speak in sentences.  Says she does not have an inhaler at [facility].

## 2023-08-07 NOTE — ED Triage Notes (Signed)
Pt comes with c/o coughing spells and sob. Pt states this started this morning. Pt states she got sob while trying to get ready.

## 2023-08-08 ENCOUNTER — Encounter: Payer: Self-pay | Admitting: Pain Medicine

## 2023-08-08 ENCOUNTER — Ambulatory Visit: Payer: Medicare Other | Attending: Pain Medicine | Admitting: Pain Medicine

## 2023-08-08 ENCOUNTER — Ambulatory Visit
Admission: RE | Admit: 2023-08-08 | Discharge: 2023-08-08 | Disposition: A | Discharge status: Home / Self Care | Payer: Medicare Other | Source: Ambulatory Visit | Attending: Pain Medicine | Admitting: Pain Medicine

## 2023-08-08 VITALS — BP 132/97 | HR 82 | Temp 97.5°F | Resp 14 | Ht 65.0 in | Wt 125.0 lb

## 2023-08-08 DIAGNOSIS — M5459 Other low back pain: Secondary | ICD-10-CM | POA: Insufficient documentation

## 2023-08-08 DIAGNOSIS — M4316 Spondylolisthesis, lumbar region: Secondary | ICD-10-CM | POA: Insufficient documentation

## 2023-08-08 DIAGNOSIS — M5136 Other intervertebral disc degeneration, lumbar region: Secondary | ICD-10-CM | POA: Diagnosis present

## 2023-08-08 DIAGNOSIS — M47816 Spondylosis without myelopathy or radiculopathy, lumbar region: Secondary | ICD-10-CM | POA: Insufficient documentation

## 2023-08-08 DIAGNOSIS — G8929 Other chronic pain: Secondary | ICD-10-CM | POA: Diagnosis present

## 2023-08-08 DIAGNOSIS — M47817 Spondylosis without myelopathy or radiculopathy, lumbosacral region: Secondary | ICD-10-CM | POA: Insufficient documentation

## 2023-08-08 DIAGNOSIS — M545 Low back pain, unspecified: Secondary | ICD-10-CM | POA: Diagnosis present

## 2023-08-08 MED ORDER — LIDOCAINE HCL 2 % IJ SOLN
INTRAMUSCULAR | Status: AC
  Filled 2023-08-08: qty 20

## 2023-08-08 MED ORDER — LIDOCAINE HCL 2 % IJ SOLN
20.0000 mL | Freq: Once | INTRAMUSCULAR | Status: AC
  Administered 2023-08-08: 400 mg

## 2023-08-08 MED ORDER — MIDAZOLAM HCL 5 MG/5ML IJ SOLN
INTRAMUSCULAR | Status: AC
  Filled 2023-08-08: qty 5

## 2023-08-08 MED ORDER — MIDAZOLAM HCL 5 MG/5ML IJ SOLN
0.5000 mg | Freq: Once | INTRAMUSCULAR | Status: AC
  Administered 2023-08-08: 2 mg via INTRAVENOUS

## 2023-08-08 MED ORDER — TRIAMCINOLONE ACETONIDE 40 MG/ML IJ SUSP
INTRAMUSCULAR | Status: AC
  Filled 2023-08-08: qty 2

## 2023-08-08 MED ORDER — FENTANYL CITRATE (PF) 100 MCG/2ML IJ SOLN
INTRAMUSCULAR | Status: AC
  Filled 2023-08-08: qty 2

## 2023-08-08 MED ORDER — LACTATED RINGERS IV SOLN: Freq: Once | INTRAVENOUS | Status: AC

## 2023-08-08 MED ORDER — ROPIVACAINE HCL 2 MG/ML IJ SOLN
INTRAMUSCULAR | Status: AC
  Filled 2023-08-08: qty 20

## 2023-08-08 MED ORDER — ROPIVACAINE HCL 2 MG/ML IJ SOLN
18.0000 mL | Freq: Once | INTRAMUSCULAR | Status: AC
  Administered 2023-08-08: 18 mL via PERINEURAL

## 2023-08-08 MED ORDER — PENTAFLUOROPROP-TETRAFLUOROETH EX AERO
INHALATION_SPRAY | Freq: Once | CUTANEOUS | Status: AC
  Administered 2023-08-08: 30 via TOPICAL
  Filled 2023-08-08: qty 116

## 2023-08-08 MED ORDER — FENTANYL CITRATE (PF) 100 MCG/2ML IJ SOLN: 25.0000 ug | INTRAMUSCULAR | Status: DC | PRN

## 2023-08-08 MED ORDER — TRIAMCINOLONE ACETONIDE 40 MG/ML IJ SUSP
80.0000 mg | Freq: Once | INTRAMUSCULAR | Status: AC
  Administered 2023-08-08: 80 mg

## 2023-08-08 NOTE — Patient Instructions (Signed)

## 2023-08-08 NOTE — Progress Notes (Signed)
PROVIDER NOTE: Interpretation of information contained herein should be left to medically-trained personnel. Specific patient instructions are provided elsewhere under "Patient Instructions" section of medical record. This document was created in part using STT-dictation technology, any transcriptional errors that may result from this process are unintentional.  Patient: Cassidy Erickson Type: Established DOB: 16-Nov-1960 MRN: [id number] PCP: [facility]  Service: Procedure DOS: 08/08/2023 Setting: Ambulatory Location: Ambulatory outpatient facility Delivery: Face-to-face Provider: Oswaldo Done, MD Specialty: Interventional Pain Management Specialty designation: 09 Location: Outpatient facility Ref. Prov.: Soles, Willaim Rayas, MD       Interventional Therapy   Procedure: Lumbar Facet, Medial Branch Block(s) #1  Laterality: Bilateral  Level: L3 (Left only), L4, L5, and S1 Medial Branch Level(s). Injecting these levels blocks the L4-5 (Left only) and L5-S1 lumbar facet joints. Imaging: Fluoroscopic guidance Spinal ([id number]) Anesthesia: Local anesthesia (1-2% Lidocaine) Anxiolysis: IV Versed 2.0 mg Sedation: Moderate Sedation None required. No Fentanyl administered.         DOS: 08/08/2023 Performed by: Oswaldo Done, MD  Primary Purpose: Diagnostic/Therapeutic Indications: Low back pain severe enough to impact quality of life or function. 1. Chronic low back pain (1ry area of Pain) (Bilateral) w/o sciatica   2. 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 Lumbosacral facet joint arthritis   4. Lumbar facet joint pain   5. Lumbar facet joint syndrome (Bilateral)   6. Spondylosis without myelopathy or radiculopathy, lumbosacral region   7. DDD (degenerative disc disease), lumbar    NAS-11 Pain score:   Pre-procedure: 7 /10   Post-procedure: 2 /10     Position / Prep / Materials:  Position: Prone  Prep solution: DuraPrep (Iodine  Povacrylex [0.7% available iodine] and Isopropyl Alcohol, 74% w/w) Area Prepped: Posterolateral Lumbosacral Spine (Wide prep: From the lower border of the scapula down to the end of the tailbone and from flank to flank.)  Materials:  Tray: Block Needle(s):  Type: Spinal  Gauge (G): 22  Length: 3.5-in Qty: 4      H&P (Pre-op Assessment):  Cassidy Erickson is a 62 y.o. (year old), female patient, seen today for interventional treatment. She  has a past surgical history that includes Femur fracture surgery (Left, 2018); Tubal ligation (1986); and Colonoscopy with propofol (N/A, 07/03/2019). Cassidy Erickson has a current medication list which includes the following prescription(s): albuterol, bupropion, cholecalciferol, ibuprofen, naproxen, naproxen sodium, omeprazole, and prednisone, and the following Facility-Administered Medications: fentanyl. Her primarily concern today is the Back Pain (lower)  Initial Vital Signs:  Pulse/HCG Rate: 82ECG Heart Rate: 60 Temp: (!) 97.5 F (36.4 C) Resp: 16 BP: 139/73 SpO2: 96 %  BMI: Estimated body mass index is 20.8 kg/m as calculated from the following:   Height as of this encounter: 5\' 5"  (1.651 m).   Weight as of this encounter: 125 lb (56.7 kg).  Risk Assessment: Allergies: Reviewed. She is allergic to erythromycin.  Allergy Precautions: None required Coagulopathies: Reviewed. None identified.  Blood-thinner therapy: None at this time Active Infection(s): Reviewed. None identified. Cassidy Erickson is afebrile  Site Confirmation: Cassidy Erickson was asked to confirm the procedure and laterality before marking the site Procedure checklist: Completed Consent: Before the procedure and under the influence of no sedative(s), amnesic(s), or anxiolytics, the patient was informed of the treatment options, risks and possible complications. To fulfill our ethical and legal obligations, as recommended by the American Medical Association's Code of Ethics, I have informed the  patient of my clinical impression; the  nature and purpose of the treatment or procedure; the risks, benefits, and possible complications of the intervention; the alternatives, including doing nothing; the risk(s) and benefit(s) of the alternative treatment(s) or procedure(s); and the risk(s) and benefit(s) of doing nothing. The patient was provided information about the general risks and possible complications associated with the procedure. These may include, but are not limited to: failure to achieve desired goals, infection, bleeding, organ or nerve damage, allergic reactions, paralysis, and death. In addition, the patient was informed of those risks and complications associated to Spine-related procedures, such as failure to decrease pain; infection (i.e.: Meningitis, epidural or intraspinal abscess); bleeding (i.e.: epidural hematoma, subarachnoid hemorrhage, or any other type of intraspinal or peri-dural bleeding); organ or nerve damage (i.e.: Any type of peripheral nerve, nerve root, or spinal cord injury) with subsequent damage to sensory, motor, and/or autonomic systems, resulting in permanent pain, numbness, and/or weakness of one or several areas of the body; allergic reactions; (i.e.: anaphylactic reaction); and/or death. Furthermore, the patient was informed of those risks and complications associated with the medications. These include, but are not limited to: allergic reactions (i.e.: anaphylactic or anaphylactoid reaction(s)); adrenal axis suppression; blood sugar elevation that in diabetics may result in ketoacidosis or comma; water retention that in patients with history of congestive heart failure may result in shortness of breath, pulmonary edema, and decompensation with resultant heart failure; weight gain; swelling or edema; medication-induced neural toxicity; particulate matter embolism and blood vessel occlusion with resultant organ, and/or nervous system infarction; and/or aseptic necrosis  of one or more joints. Finally, the patient was informed that Medicine is not an exact science; therefore, there is also the possibility of unforeseen or unpredictable risks and/or possible complications that may result in a catastrophic outcome. The patient indicated having understood very clearly. We have given the patient no guarantees and we have made no promises. Enough time was given to the patient to ask questions, all of which were answered to the patient's satisfaction. Ms. Axt has indicated that she wanted to continue with the procedure. Attestation: I, the ordering provider, attest that I have discussed with the patient the benefits, risks, side-effects, alternatives, likelihood of achieving goals, and potential problems during recovery for the procedure that I have provided informed consent. Date  Time: 08/08/2023  8:01 AM   Pre-Procedure Preparation:  Monitoring: As per clinic protocol. Respiration, ETCO2, SpO2, BP, heart rate and rhythm monitor placed and checked for adequate function Safety Precautions: Patient was assessed for positional comfort and pressure points before starting the procedure. Time-out: I initiated and conducted the "Time-out" before starting the procedure, as per protocol. The patient was asked to participate by confirming the accuracy of the "Time Out" information. Verification of the correct person, site, and procedure were performed and confirmed by me, the nursing staff, and the patient. "Time-out" conducted as per Joint Commission's Universal Protocol (UP.01.01.01). Time: 0843 Start Time: 0844 hrs.  Description of Procedure:          Laterality: (see above) Targeted Levels: (see above)  Safety Precautions: Aspiration looking for blood return was conducted prior to all injections. At no point did we inject any substances, as a needle was being advanced. Before injecting, the patient was told to immediately notify me if she was experiencing any new onset of  "ringing in the ears, or metallic taste in the mouth". No attempts were made at seeking any paresthesias. Safe injection practices and needle disposal techniques used. Medications properly checked for expiration dates. SDV (  single dose vial) medications used. After the completion of the procedure, all disposable equipment used was discarded in the proper designated medical waste containers. Local Anesthesia: Protocol guidelines were followed. The patient was positioned over the fluoroscopy table. The area was prepped in the usual manner. The time-out was completed. The target area was identified using fluoroscopy. A 12-in long, straight, sterile hemostat was used with fluoroscopic guidance to locate the targets for each level blocked. Once located, the skin was marked with an approved surgical skin marker. Once all sites were marked, the skin (epidermis, dermis, and hypodermis), as well as deeper tissues (fat, connective tissue and muscle) were infiltrated with a small amount of a short-acting local anesthetic, loaded on a 10cc syringe with a 25G, 1.5-in  Needle. An appropriate amount of time was allowed for local anesthetics to take effect before proceeding to the next step. Local Anesthetic: Lidocaine 2.0% The unused portion of the local anesthetic was discarded in the proper designated containers. Technical description of process:   (Left) L3 Medial Branch Nerve Block (MBB): The target area for the L3 medial branch is at the junction of the postero-lateral aspect of the superior articular process and the superior, posterior, and medial edge of the transverse process of L4. Under fluoroscopic guidance, a Quincke needle was inserted until contact was made with os over the superior postero-lateral aspect of the pedicular shadow (target area). After negative aspiration for blood, 0.5 mL of the nerve block solution was injected without difficulty or complication. The needle was removed intact. (Bilateral) L4  Medial Branch Nerve Block (MBB): The target area for the L4 medial branch is at the junction of the postero-lateral aspect of the superior articular process and the superior, posterior, and medial edge of the transverse process of L5. Under fluoroscopic guidance, a Quincke needle was inserted until contact was made with os over the superior postero-lateral aspect of the pedicular shadow (target area). After negative aspiration for blood, 0.5 mL of the nerve block solution was injected without difficulty or complication. The needle was removed intact. (Bilateral) L5 Medial Branch Nerve Block (MBB): The target area for the L5 medial branch is at the junction of the postero-lateral aspect of the superior articular process and the superior, posterior, and medial edge of the sacral ala. Under fluoroscopic guidance, a Quincke needle was inserted until contact was made with os over the superior postero-lateral aspect of the pedicular shadow (target area). After negative aspiration for blood, 0.5 mL of the nerve block solution was injected without difficulty or complication. The needle was removed intact. (Bilateral) S1 Medial Branch Nerve Block (MBB): The target area for the S1 medial branch is at the posterior and inferior 6 o'clock position of the L5-S1 facet joint. Under fluoroscopic guidance, the Quincke needle inserted for the L5 MBB was redirected until contact was made with os over the inferior and postero aspect of the sacrum, at the 6 o' clock position under the L5-S1 facet joint (Target area). After negative aspiration for blood, 0.5 mL of the nerve block solution was injected without difficulty or complication. The needle was removed intact.  Once the entire procedure was completed, the treated area was cleaned, making sure to leave some of the prepping solution back to take advantage of its long term bactericidal properties.         Illustration of the posterior view of the lumbar spine and the  posterior neural structures. Laminae of L2 through S1 are labeled. DPRL5, dorsal primary ramus of L5;  DPRS1, dorsal primary ramus of S1; DPR3, dorsal primary ramus of L3; FJ, facet (zygapophyseal) joint L3-L4; I, inferior articular process of L4; LB1, lateral branch of dorsal primary ramus of L1; IAB, inferior articular branches from L3 medial branch (supplies L4-L5 facet joint); IBP, intermediate branch plexus; MB3, medial branch of dorsal primary ramus of L3; NR3, third lumbar nerve root; S, superior articular process of L5; SAB, superior articular branches from L4 (supplies L4-5 facet joint also); TP3, transverse process of L3.   Facet Joint Innervation (* possible contribution)  L1-2 T12, L1 (L2*)  Medial Branch  L2-3 L1, L2 (L3*)         "          "  L3-4 L2, L3 (L4*)         "          "  L4-5 L3, L4 (L5*)         "          "  L5-S1 L4, L5, S1          "          "    Vitals:   08/08/23 0845 08/08/23 0850 08/08/23 0854 08/08/23 0901  BP: (!) 150/82 (!) 162/82 (!) 154/93 (!) 132/97  Pulse:      Resp: (!) 30 (!) 29 (!) 21 14  Temp:      SpO2: 100% 100% (!) 10% 96%  Weight:      Height:         End Time: 0854 hrs.  Imaging Guidance (Spinal):          Type of Imaging Technique: Fluoroscopy Guidance (Spinal) Indication(s): Assistance in needle guidance and placement for procedures requiring needle placement in or near specific anatomical locations not easily accessible without such assistance. Exposure Time: Please see nurses notes. Contrast: None used. Fluoroscopic Guidance: I was personally present during the use of fluoroscopy. "Tunnel Vision Technique" used to obtain the best possible view of the target area. Parallax error corrected before commencing the procedure. "Direction-depth-direction" technique used to introduce the needle under continuous pulsed fluoroscopy. Once target was reached, antero-posterior, oblique, and lateral fluoroscopic projection used confirm needle  placement in all planes. Images permanently stored in EMR. Interpretation: No contrast injected. I personally interpreted the imaging intraoperatively. Adequate needle placement confirmed in multiple planes. Permanent images saved into the patient's record.  Post-operative Assessment:  Post-procedure Vital Signs:  Pulse/HCG Rate: 8279 Temp: (!) 97.5 F (36.4 C) Resp: 14 BP: (!) 132/97 SpO2: 96 %  EBL: None  Complications: No immediate post-treatment complications observed by team, or reported by patient.  Note: The patient tolerated the entire procedure well. A repeat set of vitals were taken after the procedure and the patient was kept under observation following institutional policy, for this type of procedure. Post-procedural neurological assessment was performed, showing return to baseline, prior to discharge. The patient was provided with post-procedure discharge instructions, including a section on how to identify potential problems. Should any problems arise concerning this procedure, the patient was given instructions to immediately contact us, at any time, without hesitation. In any case, we plan to contact the patient by telephone for a follow-up status report regarding this interventional procedure.  Comments:  No additional relevant information.  Plan of Care (POC)  Orders:  Orders Placed This Encounter  Procedures   LUMBAR FACET(MEDIAL BRANCH NERVE BLOCK) MBNB    Scheduling Instructions:     Procedure: Lumbar facet block (AKA.:  Lumbosacral medial branch nerve block)     Side: Bilateral     Level: L3-4, L4-5, L5-S1, and TBD Facets (L2, L3, L4, L5, S1, and TBD Medial Branch Nerves)     Sedation: Patient's choice.     Timeframe: Today    Order Specific Question:   Where will this procedure be performed?    Answer:   [facility]   DG PAIN [facility] C-ARM 1-60 MIN NO REPORT    Intraoperative interpretation by procedural physician at [facility].    Standing  Status:   Standing    Number of Occurrences:   1    Order Specific Question:   Reason for exam:    Answer:   Assistance in needle guidance and placement for procedures requiring needle placement in or near specific anatomical locations not easily accessible without such assistance.   Informed Consent Details: Physician/Practitioner Attestation; Transcribe to consent form and obtain patient signature    Nursing Order: Transcribe to consent form and obtain patient signature. Note: Always confirm laterality of pain with Ms. Degood, before procedure.    Order Specific Question:   Physician/Practitioner attestation of informed consent for procedure/surgical case    Answer:   I, the physician/practitioner, attest that I have discussed with the patient the benefits, risks, side effects, alternatives, likelihood of achieving goals and potential problems during recovery for the procedure that I have provided informed consent.    Order Specific Question:   Procedure    Answer:   Lumbar Facet Block  under fluoroscopic guidance    Order Specific Question:   Physician/Practitioner performing the procedure    Answer:   Demaris Leavell A. Laban Emperor MD    Order Specific Question:   Indication/Reason    Answer:   Low Back Pain, with our without leg pain, due to Facet Joint Arthralgia (Joint Pain) Spondylosis (Arthritis of the Spine), without myelopathy or radiculopathy (Nerve Damage).   Provide equipment / supplies at bedside    Procedure tray: "Block Tray" (Disposable  single use) Skin infiltration needle: Regular 1.5-in, 25-G, (x1) Block Needle type: Spinal Amount/quantity: 4 Size: Regular (3.5-inch) Gauge: 22G    Standing Status:   Standing    Number of Occurrences:   1    Order Specific Question:   Specify    Answer:   Block Tray   Chronic Opioid Analgesic:  None MME/day: 0 mg/day   Medications ordered for procedure: Meds ordered this encounter  Medications   lidocaine (XYLOCAINE) 2 % (with pres)  injection 400 mg   pentafluoroprop-tetrafluoroeth (GEBAUERS) aerosol   lactated ringers infusion   midazolam (VERSED) 5 MG/5ML injection 0.5-2 mg    Make sure Flumazenil is available in the pyxis when using this medication. If oversedation occurs, administer 0.2 mg IV over 15 sec. If after 45 sec no response, administer 0.2 mg again over 1 min; may repeat at 1 min intervals; not to exceed 4 doses (1 mg)   fentaNYL (SUBLIMAZE) injection 25-50 mcg    Make sure Narcan is available in the pyxis when using this medication. In the event of respiratory depression (RR< 8/min): Titrate NARCAN (naloxone) in increments of 0.1 to 0.2 mg IV at 2-3 minute intervals, until desired degree of reversal.   ropivacaine (PF) 2 mg/mL (0.2%) (NAROPIN) injection 18 mL   triamcinolone acetonide (KENALOG-40) injection 80 mg   Medications administered: We administered lidocaine, pentafluoroprop-tetrafluoroeth, lactated ringers, midazolam, ropivacaine (PF) 2 mg/mL (0.2%), and triamcinolone acetonide.  See the medical record for exact dosing,  route, and time of administration.  Follow-up plan:   Return in about 2 weeks (around 08/22/2023) for (Face2F), (PPE).       Interventional Therapies  Risk Factors  Considerations  Medical Comorbidities:     Planned  Pending:      Under consideration:   Diagnostic bilateral lumbar facet MBB #1  Diagnostic right L4-5 LESI #1  Diagnostic bilateral IA hip injection #1    Completed:   None at this time   Therapeutic  Palliative (PRN) options:   None established   Completed by other providers:   Diagnostic/therapeutic bilateral SI joint injection x1 (02/15/2017) by Mila Palmer, MD ([facility])          Recent Visits Date Type Provider Dept  07/12/23 Office Visit Delano Metz, MD [facility]  06/12/23 Office Visit Delano Metz, MD [facility]  Showing recent visits within past 90 days and meeting all other  requirements Today's Visits Date Type Provider Dept  08/08/23 Procedure visit Delano Metz, MD [facility]  Showing today's visits and meeting all other requirements Future Appointments Date Type Provider Dept  08/22/23 Appointment Delano Metz, MD [facility]  Showing future appointments within next 90 days and meeting all other requirements  Disposition: Discharge home  Discharge (Date  Time): 08/08/2023; 0910 hrs.   Primary Care Physician: [facility] Location: [facility] Note by: Oswaldo Done, MD (TTS technology used. I apologize for any typographical errors that were not detected and corrected.) Date: 08/08/2023; Time: 11:49 AM  Disclaimer:  Medicine is not an Visual merchandiser. The only guarantee in medicine is that nothing is guaranteed. It is important to note that the decision to proceed with this intervention was based on the information collected from the patient. The Data and conclusions were drawn from the patient's questionnaire, the interview, and the physical examination. Because the information was provided in large part by the patient, it cannot be guaranteed that it has not been purposely or unconsciously manipulated. Every effort has been made to obtain as much relevant data as possible for this evaluation. It is important to note that the conclusions that lead to this procedure are derived in large part from the available data. Always take into account that the treatment will also be dependent on availability of resources and existing treatment guidelines, considered by other Pain Management Practitioners as being common knowledge and practice, at the time of the intervention. For Medico-Legal purposes, it is also important to point out that variation in procedural techniques and pharmacological choices are the acceptable norm. The indications, contraindications, technique, and results of  the above procedure should only be interpreted and judged by a Board-Certified Interventional Pain Specialist with extensive familiarity and expertise in the same exact procedure and technique.

## 2023-08-08 NOTE — Progress Notes (Signed)
Safety precautions to be maintained throughout the outpatient stay will include: orient to surroundings, keep bed in low position, maintain call bell within reach at all times, provide assistance with transfer out of bed and ambulation.   Patient wetn to ED yesterday for a cough. Steroid given x4 days. Notified MD. Patient is not sick. OK to carry on with procedure.

## 2023-08-09 ENCOUNTER — Telehealth: Payer: Self-pay

## 2023-08-09 NOTE — Telephone Encounter (Signed)
Post procedure follow up.  Patient states she is doing well.   ?

## 2023-08-10 ENCOUNTER — Encounter: Payer: Self-pay | Admitting: Oncology

## 2023-08-10 ENCOUNTER — Inpatient Hospital Stay: Payer: Medicare Other | Attending: Oncology | Admitting: Oncology

## 2023-08-10 ENCOUNTER — Inpatient Hospital Stay: Payer: Medicare Other

## 2023-08-10 VITALS — BP 134/85 | HR 73 | Temp 98.2°F | Resp 16 | Ht 65.0 in | Wt 127.0 lb

## 2023-08-10 DIAGNOSIS — D751 Secondary polycythemia: Secondary | ICD-10-CM | POA: Insufficient documentation

## 2023-08-10 DIAGNOSIS — F17210 Nicotine dependence, cigarettes, uncomplicated: Secondary | ICD-10-CM | POA: Diagnosis not present

## 2023-08-10 LAB — CBC (CANCER CENTER ONLY)
HCT: 45.5 % (ref 36.0–46.0)
Hemoglobin: 15.4 g/dL — ABNORMAL HIGH (ref 12.0–15.0)
MCH: 32.4 pg (ref 26.0–34.0)
MCHC: 33.8 g/dL (ref 30.0–36.0)
MCV: 95.6 fL (ref 80.0–100.0)
Platelet Count: 264 10*3/uL (ref 150–400)
RBC: 4.76 MIL/uL (ref 3.87–5.11)
RDW: 11.9 % (ref 11.5–15.5)
WBC Count: 6.4 10*3/uL (ref 4.0–10.5)
nRBC: 0 % (ref 0.0–0.2)

## 2023-08-10 LAB — IRON AND TIBC
Iron: 144 ug/dL (ref 28–170)
Saturation Ratios: 39 % — ABNORMAL HIGH (ref 10.4–31.8)
TIBC: 372 ug/dL (ref 250–450)
UIBC: 228 ug/dL

## 2023-08-10 LAB — FERRITIN: Ferritin: 107 ng/mL (ref 11–307)

## 2023-08-10 NOTE — Progress Notes (Signed)
[facility]  [phone]) [phone] [phone]) [phone]  ID: [name] OB: 10/04/1961  MR#: [id number]  QQV#:[id number]  Patient Care Team: Center, [facility] as PCP - General Jim Like, RN as Registered Nurse Scarlett Presto, RN (Inactive) as Registered Nurse  CHIEF COMPLAINT: Polycythemia.  INTERVAL HISTORY: Patient is a 62 year old female who was noted to have a mildly increased hemoglobin on routine blood work.  She currently feels well and is asymptomatic.  She has no neurologic complaints.  She denies any recent fevers or illnesses.  She has a good appetite and denies weight loss.  She has no chest pain, shortness of breath, cough, or hemoptysis.  She denies any nausea, vomiting, constipation, or diarrhea.  She has no urinary complaints.  Patient offers no specific complaints today.  REVIEW OF SYSTEMS:   Review of Systems  Constitutional: Negative.  Negative for fever, malaise/fatigue and weight loss.  Respiratory: Negative.  Negative for cough, hemoptysis and shortness of breath.   Cardiovascular: Negative.  Negative for chest pain and leg swelling.  Gastrointestinal: Negative.  Negative for abdominal pain.  Genitourinary: Negative.  Negative for dysuria.  Musculoskeletal: Negative.  Negative for back pain.  Skin: Negative.  Negative for rash.  Neurological: Negative.  Negative for dizziness, focal weakness, weakness and headaches.  Psychiatric/Behavioral: Negative.  The patient is not nervous/anxious.     As per HPI. Otherwise, a complete review of systems is negative.  PAST MEDICAL HISTORY: Past Medical History:  Diagnosis Date   Arthritis    GERD (gastroesophageal reflux disease)     PAST SURGICAL HISTORY: Past Surgical History:  Procedure Laterality Date   COLONOSCOPY WITH PROPOFOL N/A 07/03/2019   Procedure: COLONOSCOPY WITH PROPOFOL;  Surgeon: Wyline Mood, MD;  Location: [facility] ENDOSCOPY;  Service: Gastroenterology;   Laterality: N/A;   FEMUR FRACTURE SURGERY Left 2018   TUBAL LIGATION  1986    FAMILY HISTORY: Family History  Problem Relation Age of Onset   Arthritis Mother    COPD Father    Heart disease Father    Arthritis Sister    COPD Sister     ADVANCED DIRECTIVES (Y/N):  N  HEALTH MAINTENANCE: Social History   Tobacco Use   Smoking status: Some Days    Current packs/day: 0.50    Types: Cigarettes   Smokeless tobacco: Never   Tobacco comments:    Using the patch now, trying to quit - 12/30/22  Vaping Use   Vaping status: Never Used  Substance Use Topics   Alcohol use: Yes    Alcohol/week: 14.0 standard drinks of alcohol    Types: 14 Cans of beer per week    Comment: 2 beers/day - occasionally   Drug use: Not Currently     Colonoscopy:  PAP:  Bone density:  Lipid panel:  Allergies  Allergen Reactions   Erythromycin Nausea And Vomiting    Current Outpatient Medications  Medication Sig Dispense Refill   albuterol (VENTOLIN HFA) 108 (90 Base) MCG/ACT inhaler Inhale 2 puffs into the lungs every 6 (six) hours as needed for wheezing or shortness of breath. 8.5 g 2   buPROPion (WELLBUTRIN SR) 150 MG 12 hr tablet      cetirizine (ZYRTEC) 10 MG tablet Take 10 mg by mouth daily.     cholecalciferol (VITAMIN D3) 25 MCG (1000 UNIT) tablet Take 1,000 Units by mouth daily.     cyclobenzaprine (FLEXERIL) 10 MG tablet Take 10 mg by mouth 2 (two) times daily as needed.  ibuprofen (ADVIL) 600 MG tablet      naproxen (NAPROSYN) 500 MG tablet      naproxen sodium (ALEVE) 220 MG tablet Take by mouth.     omeprazole (PRILOSEC) 20 MG capsule Take 20 mg by mouth daily.     predniSONE (DELTASONE) 20 MG tablet Take 2 tablets (40 mg total) by mouth daily with breakfast for 4 days. 8 tablet 0   No current facility-administered medications for this visit.    OBJECTIVE: Vitals:   08/10/23 1111  BP: 134/85  Pulse: 73  Resp: 16  Temp: 98.2 F (36.8 C)  SpO2: 97%     Body mass index  is 21.13 kg/m.    ECOG FS:0 - Asymptomatic  General: Well-developed, well-nourished, no acute distress. Eyes: Pink conjunctiva, anicteric sclera. HEENT: Normocephalic, moist mucous membranes. Lungs: No audible wheezing or coughing. Heart: Regular rate and rhythm. Abdomen: Soft, nontender, no obvious distention. Musculoskeletal: No edema, cyanosis, or clubbing. Neuro: Alert, answering all questions appropriately. Cranial nerves grossly intact. Skin: No rashes or petechiae noted. Psych: Normal affect. Lymphatics: No cervical, calvicular, axillary or inguinal LAD.   LAB RESULTS:  Lab Results  Component Value Date   NA 137 08/07/2023   K 3.5 08/07/2023   CL 103 08/07/2023   CO2 23 08/07/2023   GLUCOSE 127 (H) 08/07/2023   BUN 11 08/07/2023   CREATININE 0.59 08/07/2023   CALCIUM 9.4 08/07/2023   PROT 7.3 06/12/2023   ALBUMIN 4.7 06/12/2023   AST 34 06/12/2023   ALKPHOS 116 06/12/2023   BILITOT 0.3 06/12/2023   GFRNONAA >60 08/07/2023    Lab Results  Component Value Date   WBC 6.4 08/10/2023   HGB 15.4 (H) 08/10/2023   HCT 45.5 08/10/2023   MCV 95.6 08/10/2023   PLT 264 08/10/2023     STUDIES: DG PAIN CLINIC C-ARM 1-60 MIN NO REPORT  Result Date: 08/08/2023 Fluoro was used, but no Radiologist interpretation will be provided. Please refer to "NOTES" tab for provider progress note.  DG Chest 2 View  Result Date: 08/07/2023 CLINICAL DATA:  Shortness of breath, cough. EXAM: CHEST - 2 VIEW COMPARISON:  January 29, 2012. FINDINGS: The heart size and mediastinal contours are within normal limits. Both lungs are clear. The visualized skeletal structures are unremarkable. IMPRESSION: No active cardiopulmonary disease. Electronically Signed   By: Lupita Raider M.D.   On: 08/07/2023 12:53    ASSESSMENT: Polycythemia.  PLAN:    Polycythemia: Patient's most recent hemoglobin is only mildly elevated at 15.4.  All of her other laboratory work from today including iron panel,  erythropoietin level, carbon monoxide level, and JAK2 mutation are all pending at time of dictation.  No intervention is needed at this time.  Patient will have video-assisted telemedicine visit in 3 weeks to discuss the results.  I spent a total of 45 minutes reviewing chart data, face-to-face evaluation with the patient, counseling and coordination of care as detailed above.   Patient expressed understanding and was in agreement with this plan. She also understands that She can call clinic at any time with any questions, concerns, or complaints.    Jeralyn Ruths, MD   08/10/2023 1:22 PM

## 2023-08-11 ENCOUNTER — Inpatient Hospital Stay: Payer: Medicare Other

## 2023-08-11 LAB — ERYTHROPOIETIN: Erythropoietin: 3.2 m[IU]/mL (ref 2.6–18.5)

## 2023-08-11 LAB — CARBON MONOXIDE, BLOOD (PERFORMED AT REF LAB): Carbon Monoxide, Blood: 5.1 % — ABNORMAL HIGH (ref 0.0–3.6)

## 2023-08-11 NOTE — Progress Notes (Signed)
[facility] Clinical Social Work  Clinical Social Work was referred by medical provider for assessment of psychosocial needs.  Clinical Social Worker contacted patient by phone to offer support and assess for needs. CSW reviewed SDOH areas, patient expressed need for food due to not being able to receive food benefits. No other SDOH concerns at this time. CSW will send patient resources in the area and discussed the food pantry. Patient did not have any additional questions at this time.  Marguerita Merles, LCSWA Clinical Social Worker [facility]

## 2023-08-21 LAB — CALR +MPL + E12-E15  (REFLEX)

## 2023-08-21 LAB — JAK2 V617F RFX CALR/MPL/E12-15

## 2023-08-21 NOTE — Progress Notes (Unsigned)
PROVIDER NOTE: Information contained herein reflects review and annotations entered in association with encounter. Interpretation of such information and data should be left to medically-trained personnel. Information provided to patient can be located elsewhere in the medical record under "Patient Instructions". Document created using STT-dictation technology, any transcriptional errors that may result from process are unintentional.    Patient: Cassidy Erickson  Service Category: E/M  Provider: Oswaldo Done, MD  DOB: 1961-06-04  DOS: 08/22/2023  Referring Provider: [facility], [facility]*  MRN: [id number]  Specialty: Interventional Pain Management  PCP: Center, [facility]  Type: Established Patient  Setting: Ambulatory outpatient    Location: Office  Delivery: Face-to-face     HPI  Cassidy Erickson, a 62 y.o. year old female, is here today because of her No primary diagnosis found.. Cassidy Erickson primary complain today is No chief complaint on file.  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 Pain Assessment: Severity of   is reported as a  /10. Location:    / . Onset:  . Quality:  . Timing:  . Modifying factor(s):   Marland Kitchen Vitals:  vitals were not taken for this visit.  BMI: Estimated body mass index is 21.13 kg/m as calculated from the following:   Height as of 08/10/23: 5\' 5"  (1.651 m).   Weight as of 08/10/23: 127 lb (57.6 kg). Last encounter: 07/12/2023. Last procedure: 08/08/2023.  Reason for encounter: post-procedure evaluation and assessment. ***  Post-procedure evaluation   Procedure: Lumbar Facet, Medial Branch Block(s) #1  Laterality: Bilateral  Level: L3 (Left only), L4, L5, and S1 Medial Branch Level(s). Injecting these levels blocks the L4-5 (Left only) and L5-S1 lumbar facet joints. Imaging: Fluoroscopic guidance Spinal ([id number]) Anesthesia: Local anesthesia (1-2% Lidocaine) Anxiolysis: IV Versed 2.0 mg Sedation: Moderate Sedation None required. No Fentanyl administered.         DOS: 08/08/2023 Performed by: Oswaldo Done, MD  Primary Purpose: Diagnostic/Therapeutic Indications: Low back pain severe enough to impact quality of life or function. 1. Chronic low back pain (1ry area of Pain) (Bilateral) w/o sciatica   2. 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 Lumbosacral facet joint arthritis   4. Lumbar facet joint pain   5. Lumbar facet joint syndrome (Bilateral)   6. Spondylosis without myelopathy or radiculopathy, lumbosacral region   7. DDD (degenerative disc disease), lumbar    NAS-11 Pain score:   Pre-procedure: 7 /10   Post-procedure: 2 /10      Effectiveness:  Initial hour after procedure:   ***. Subsequent 4-6 hours post-procedure:   ***. Analgesia past initial 6 hours:   ***. Ongoing improvement:  Analgesic:  *** Function:    ***    ROM:    ***     Pharmacotherapy Assessment  Analgesic: None MME/day: 0 mg/day   Monitoring: [place] PMP: PDMP reviewed during this encounter.  Pharmacotherapy: No side-effects or adverse reactions reported. Compliance: No problems identified. Effectiveness: Clinically acceptable.  No notes on  file  No results found for: "CBDTHCR" No results found for: "D8THCCBX" No results found for: "D9THCCBX"  UDS:  Summary  Date Value Ref Range Status  06/12/2023 Note  Final    Comment:    ==================================================================== Compliance Drug Analysis, Ur ==================================================================== Test                             Result       Flag       Units  Drug Present and Declared for Prescription Verification   Naproxen                       PRESENT      EXPECTED  Drug Present not Declared for Prescription Verification   Benzoylecgonine                2164         UNEXPECTED ng/mg creat    Benzoylecgonine is a metabolite of cocaine; its presence indicates    use of this drug.  Source is most commonly illicit, but cocaine is    present in some topical anesthetic solutions.  Drug Absent but Declared for Prescription Verification   Bupropion                      Not Detected UNEXPECTED   Ibuprofen                      Not Detected UNEXPECTED    Ibuprofen, as indicated in the declared medication list, is not    always detected even when used as directed.  ==================================================================== Test                      Result    Flag   Units      Ref Range   Creatinine              97               mg/dL      >=40 ==================================================================== Declared Medications:  The flagging and interpretation on this report are based on the  following declared medications.  Unexpected results may arise from  inaccuracies in the declared medications.   **Note: The testing scope of this panel includes these medications:   Bupropion (Wellbutrin SR)  Naproxen (Naprosyn)   **Note: The testing scope of this panel does not include small to  moderate amounts of these reported medications:   Ibuprofen (Advil)   **Note: The testing scope of this panel does not  include the  following reported medications:   Omeprazole (Prilosec)  Vitamin D3 ==================================================================== For clinical consultation, please call [phone]. ====================================================================       ROS  Constitutional: Denies any fever or chills Gastrointestinal: No reported hemesis, hematochezia, vomiting, or acute GI distress Musculoskeletal: Denies any acute onset joint swelling, redness, loss of ROM, or weakness Neurological: No reported episodes of acute onset apraxia, aphasia, dysarthria, agnosia, amnesia, paralysis, loss of coordination, or loss of consciousness  Medication Review  albuterol, buPROPion, cetirizine, cholecalciferol, cyclobenzaprine, ibuprofen, naproxen, naproxen sodium, and omeprazole  History Review  Allergy: Cassidy Erickson is allergic to erythromycin. Drug: Cassidy Erickson  reports that she does not currently use drugs. Alcohol:  reports current alcohol use of about 14.0 standard  drinks of alcohol per week. Tobacco:  reports that she has been smoking cigarettes. She has never used smokeless tobacco. Social: Cassidy Erickson  reports that she has been smoking cigarettes. She has never used smokeless tobacco. She reports current alcohol use of about 14.0 standard drinks of alcohol per week. She reports that she does not currently use drugs. Medical:  has a past medical history of Arthritis and GERD (gastroesophageal reflux disease). Surgical: Cassidy Erickson  has a past surgical history that includes Femur fracture surgery (Left, 2018); Tubal ligation (1986); and Colonoscopy with propofol (N/A, 07/03/2019). Family: family history includes Arthritis in her mother and sister; COPD in her father and sister; Heart disease in her father.  Laboratory Chemistry Profile   Renal Lab Results  Component Value Date   BUN 11 08/07/2023   CREATININE 0.59 08/07/2023   BCR 11 (L) 06/12/2023   GFRNONAA >60  08/07/2023    Hepatic Lab Results  Component Value Date   AST 34 06/12/2023   ALBUMIN 4.7 06/12/2023   ALKPHOS 116 06/12/2023    Electrolytes Lab Results  Component Value Date   NA 137 08/07/2023   K 3.5 08/07/2023   CL 103 08/07/2023   CALCIUM 9.4 08/07/2023   MG 1.8 06/12/2023    Bone Lab Results  Component Value Date   25OHVITD1 34 06/12/2023   25OHVITD2 5.8 06/12/2023   25OHVITD3 28 06/12/2023    Inflammation (CRP: Acute Phase) (ESR: Chronic Phase) Lab Results  Component Value Date   CRP 3 06/12/2023   ESRSEDRATE 3 06/12/2023         Note: Above Lab results reviewed.  Recent Imaging Review  DG PAIN CLINIC C-ARM 1-60 MIN NO REPORT Fluoro was used, but no Radiologist interpretation will be provided.  Please refer to "NOTES" tab for provider progress note. Note: Reviewed        Physical Exam  General appearance: Well nourished, well developed, and well hydrated. In no apparent acute distress Mental status: Alert, oriented x 3 (person, place, & time)       Respiratory: No evidence of acute respiratory distress Eyes: PERLA Vitals: There were no vitals taken for this visit. BMI: Estimated body mass index is 21.13 kg/m as calculated from the following:   Height as of 08/10/23: 5\' 5"  (1.651 m).   Weight as of 08/10/23: 127 lb (57.6 kg). Ideal: Ideal body weight: 57 kg (125 lb 10.6 oz) Adjusted ideal body weight: 57.2 kg (126 lb 3.2 oz)  Assessment   Diagnosis Status  No diagnosis found. Controlled Controlled Controlled   Updated Problems: No problems updated.  Plan of Care  Problem-specific:  No problem-specific Assessment & Plan notes found for this encounter.  Cassidy Erickson has a current medication list which includes the following long-term medication(s): albuterol, bupropion, cetirizine, and omeprazole.  Pharmacotherapy (Medications Ordered): No orders of the defined types were placed in this encounter.  Orders:  No orders of the defined  types were placed in this encounter.  Follow-up plan:   No follow-ups on file.      Interventional Therapies  Risk Factors  Considerations  Medical Comorbidities:     Planned  Pending:      Under consideration:   Diagnostic bilateral lumbar facet MBB #1  Diagnostic right L4-5 LESI #1  Diagnostic bilateral IA hip injection #1    Completed:   None at this time   Therapeutic  Palliative (PRN) options:   None established   Completed by other providers:  Diagnostic/therapeutic bilateral SI joint injection x1 (02/15/2017) by Mila Palmer, MD ([facility])           Recent Visits Date Type Provider Dept  08/08/23 Procedure visit Delano Metz, MD [facility]  07/12/23 Office Visit Delano Metz, MD [facility]  06/12/23 Office Visit Delano Metz, MD [facility]  Showing recent visits within past 90 days and meeting all other requirements Future Appointments Date Type Provider Dept  08/22/23 Appointment Delano Metz, MD [facility]  Showing future appointments within next 90 days and meeting all other requirements  I discussed the assessment and treatment plan with the patient. The patient was provided an opportunity to ask questions and all were answered. The patient agreed with the plan and demonstrated an understanding of the instructions.  Patient advised to call back or seek an in-person evaluation if the symptoms or condition worsens.  Duration of encounter: *** minutes.  Total time on encounter, as per AMA guidelines included both the face-to-face and non-face-to-face time personally spent by the physician and/or other qualified health care professional(s) on the day of the encounter (includes time in activities that require the physician or other qualified health care professional and does not include time in activities normally performed by clinical staff). Physician's time may include the following  activities when performed: Preparing to see the patient (e.g., pre-charting review of records, searching for previously ordered imaging, lab work, and nerve conduction tests) Review of prior analgesic pharmacotherapies. Reviewing PMP Interpreting ordered tests (e.g., lab work, imaging, nerve conduction tests) Performing post-procedure evaluations, including interpretation of diagnostic procedures Obtaining and/or reviewing separately obtained history Performing a medically appropriate examination and/or evaluation Counseling and educating the patient/family/caregiver Ordering medications, tests, or procedures Referring and communicating with other health care professionals (when not separately reported) Documenting clinical information in the electronic or other health record Independently interpreting results (not separately reported) and communicating results to the patient/ family/caregiver Care coordination (not separately reported)  Note by: Oswaldo Done, MD Date: 08/22/2023; Time: 12:47 PM

## 2023-08-22 ENCOUNTER — Ambulatory Visit: Payer: Medicare HMO | Attending: Pain Medicine | Admitting: Pain Medicine

## 2023-08-22 ENCOUNTER — Encounter: Payer: Self-pay | Admitting: Pain Medicine

## 2023-08-22 VITALS — BP 129/87 | HR 74 | Temp 97.4°F | Resp 16 | Ht 65.0 in | Wt 125.0 lb

## 2023-08-22 DIAGNOSIS — M545 Low back pain, unspecified: Secondary | ICD-10-CM | POA: Diagnosis not present

## 2023-08-22 DIAGNOSIS — G8929 Other chronic pain: Secondary | ICD-10-CM | POA: Insufficient documentation

## 2023-08-22 DIAGNOSIS — M5459 Other low back pain: Secondary | ICD-10-CM | POA: Diagnosis present

## 2023-08-22 DIAGNOSIS — Z09 Encounter for follow-up examination after completed treatment for conditions other than malignant neoplasm: Secondary | ICD-10-CM | POA: Insufficient documentation

## 2023-08-22 DIAGNOSIS — M47817 Spondylosis without myelopathy or radiculopathy, lumbosacral region: Secondary | ICD-10-CM | POA: Diagnosis not present

## 2023-08-22 DIAGNOSIS — M47816 Spondylosis without myelopathy or radiculopathy, lumbar region: Secondary | ICD-10-CM | POA: Diagnosis present

## 2023-08-22 NOTE — Patient Instructions (Signed)
______________________________________________________________________    OTC Supplements:   The following is a list of over-the-counter (OTC) supplements that have been found to have [employer]) studies suggesting that they may be of some benefits when used in moderation in some chronic pain-related conditions.  NOTE:  Always consult with your primary care provider and/or pharmacist before taking any OTC medications to make sure they will not interact with your current medications. Always use manufacturer's recommended dosage.  Supplement Possible benefit May be of benefit in treatment of  Active ingredient(s)  Turmeric/curcumin anti-inflammatory Joint and muscle aches and pain associated with arthritis and inflammation The main active ingredient in turmeric is curcumin.  Glucosamine/chondroitin (triple strength) may slow loss of articular cartilage Osteoarthritis glucosamine HCl and chondroitin sulfate  Vitamin D-3 may suppress release of chemicals associated with inflammation Joint and muscle aches and pain associated with arthritis and inflammation CHOLECALCIFEROL; ALPHA.-TOCOPHEROL, D  Moringa anti-inflammatory with mild analgesic effects Joint and muscle aches and pain associated with arthritis and inflammation Many:  Phenolic acids: Gallic acid, ellagic acid, ferulic acid, caffeic acid, o-coumaric acid, and chlorogenic acid. Flavonoids: Rutin, quercetin, rhamnetin, kaempferol, apigenin, and myricetin. Terpenes: Lutein, all-E-luteoxanthin, 13-Z-lutein, 15-Z-?-carotene, and all-E-zeaxanthin. Alkaloids: Marumoside A, marumoside B, and pyrrolemarumine-4?-O-?-L-rhamnopyranoside. Vitamins: Vitamin A and vitamin C. Protein: Milk protein  Melatonin Helps reset sleep cycle. Insomnia. May also be helpful in neurodegenerative disorders melatonin, also known as N-acetyl-5-methoxytryptamine  Vitamin B-12 may help keep nerves and blood cells healthy as well as maintaining function of  nervous system Neuropathies. Nerve pain (Burning pain) methylcobalamin and 5-deoxyadenosylcobalamin  Alpha-Lipoic-Acid (ALA) antioxidant that may help with nerve health, pain, and blocking the activation of some inflammatory chemicals Diabetic neuropathy and metabolic syndrome Alpha-lipoic acid (LA), or 1,2-dithiolane-3-pentanoic acid  superoxide dismutase (SOD) Currently being reviewed.  Superoxide dismutase (SOD); Copper-zinc superoxide dismutase  Tiger Balm Currently being reviewed.  Camphor; Menthol; Capsicum Extract (Capsicin); Methyl Salicylate; Essential Oils (Cassia Oil, [place] Oil, Clove Oil, and Dementholized Mint Oil)    ______________________________________________________________________

## 2023-08-28 ENCOUNTER — Ambulatory Visit (INDEPENDENT_AMBULATORY_CARE_PROVIDER_SITE_OTHER): Payer: Medicare Other | Admitting: Podiatry

## 2023-08-28 ENCOUNTER — Institutional Professional Consult (permissible substitution): Payer: Medicare HMO | Admitting: Pulmonary Disease

## 2023-08-28 DIAGNOSIS — Z91199 Patient's noncompliance with other medical treatment and regimen due to unspecified reason: Secondary | ICD-10-CM

## 2023-08-28 NOTE — Progress Notes (Deleted)
   Synopsis: Referred in by Dudley Major, FNP   Subjective:   PATIENT ID: Cassidy Erickson GENDER: female DOB: 1961/07/09, MRN: [id number]  No chief complaint on file.   HPI ***    Family history:  Social history:    ROS  Objective:  There were no vitals filed for this visit.   on *** LPM *** RA BMI Readings from Last 3 Encounters:  08/22/23 20.80 kg/m  08/10/23 21.13 kg/m  08/08/23 20.80 kg/m   Wt Readings from Last 3 Encounters:  08/22/23 125 lb (56.7 kg)  08/10/23 127 lb (57.6 kg)  08/08/23 125 lb (56.7 kg)    Physical Exam GEN: NAD, Healthy Appearing HEENT: Supple Neck, Reactive Pupils, EOMI  CVS: Normal S1, Normal S2, RRR, No murmurs or ES appreciated  Lungs: Clear bilateral air entry.  Abdomen: Soft, non tender, non distended, + BS  Extremities: Warm and well perfused, No edema  Skin: No suspicious lesions appreciated  Psych: Normal Affect  Ancillary Information   CBC    Component Value Date/Time   WBC 6.4 08/10/2023 1145   WBC 5.5 08/07/2023 1105   RBC 4.76 08/10/2023 1145   HGB 15.4 (H) 08/10/2023 1145   HCT 45.5 08/10/2023 1145   PLT 264 08/10/2023 1145   MCV 95.6 08/10/2023 1145   MCH 32.4 08/10/2023 1145   MCHC 33.8 08/10/2023 1145   RDW 11.9 08/10/2023 1145    Imaging  CXR 07/2023: No active cardiopulmonary disease.   LDCT 06/2023:  LUNGS, AIRWAYS, AND PLEURA: Minimal centrilobular emphysema mild diffuse bronchial wall thickening. Stable pleural-based anterior right upper lobe nodule measuring 0.7 cm and left lower lobe fissural nodule measuring 0.4 cm. No new or enlarging pulmonary nodules. No new consolidation. Clear central airways. No pleural effusion or pneumothorax.  MEDIASTINUM AND LYMPH NODES: No enlarged intrathoracic, axillary, or supraclavicular lymph nodes. No mediastinal mass or other abnormality.   Echocardiogram ():   PFTs ()      No data to display           Assessment & Plan:    No follow-ups on  file.  I spent *** minutes caring for this patient today, including {EM billing:28027}  Janann Colonel, MD  Pulmonary Critical Care 08/28/2023 9:25 AM

## 2023-08-29 NOTE — Progress Notes (Signed)
1. No-show for appointment     

## 2023-09-01 ENCOUNTER — Inpatient Hospital Stay: Payer: Medicare HMO | Attending: Oncology | Admitting: Oncology

## 2023-09-01 DIAGNOSIS — Z720 Tobacco use: Secondary | ICD-10-CM | POA: Diagnosis not present

## 2023-09-01 DIAGNOSIS — D751 Secondary polycythemia: Secondary | ICD-10-CM

## 2023-09-01 NOTE — Progress Notes (Signed)
[facility]  [phone]) [phone] Fax:([phone]) [phone]  ID: Cassidy Erickson OB: [birth year]  MR#: [id number]  UXL#:[id number]  Patient Care Team: [facility], [facility] as PCP - General Jim Like, RN as Registered Nurse Scarlett Presto, RN (Inactive) as Registered Nurse  I connected with Cassidy Erickson on 09/01/23 at 11:15 AM EDT by video enabled telemedicine visit and verified that I am speaking with the correct person using two identifiers.   I discussed the limitations, risks, security and privacy concerns of performing an evaluation and management service by telemedicine and the availability of in-person appointments. I also discussed with the patient that there may be a patient responsible charge related to this service. The patient expressed understanding and agreed to proceed.   Other persons participating in the visit and their role in the encounter: Patient, MD.  Patient's location: Home. Provider's location: Clinic.  CHIEF COMPLAINT: Polycythemia.  INTERVAL HISTORY: Patient agreed to video assisted telemedicine visit for further evaluation and discussion of her laboratory results.  She continues to feel well and remains asymptomatic. She has no neurologic complaints.  She denies any recent fevers or illnesses.  She has a good appetite and denies weight loss.  She has no chest pain, shortness of breath, cough, or hemoptysis.  She denies any nausea, vomiting, constipation, or diarrhea.  She has no urinary complaints.  Patient offers no specific complaints today.  REVIEW OF SYSTEMS:   Review of Systems  Constitutional: Negative.  Negative for fever, malaise/fatigue and weight loss.  Respiratory: Negative.  Negative for cough, hemoptysis and shortness of breath.   Cardiovascular: Negative.  Negative for chest pain and leg swelling.  Gastrointestinal: Negative.  Negative for abdominal pain.  Genitourinary: Negative.  Negative for dysuria.   Musculoskeletal: Negative.  Negative for back pain.  Skin: Negative.  Negative for rash.  Neurological: Negative.  Negative for dizziness, focal weakness, weakness and headaches.  Psychiatric/Behavioral: Negative.  The patient is not nervous/anxious.     As per HPI. Otherwise, a complete review of systems is negative.  PAST MEDICAL HISTORY: Past Medical History:  Diagnosis Date   Arthritis    GERD (gastroesophageal reflux disease)     PAST SURGICAL HISTORY: Past Surgical History:  Procedure Laterality Date   COLONOSCOPY WITH PROPOFOL N/A 07/03/2019   Procedure: COLONOSCOPY WITH PROPOFOL;  Surgeon: Wyline Mood, MD;  Location: [facility];  Service: Gastroenterology;  Laterality: N/A;   FEMUR FRACTURE SURGERY Left 2018   TUBAL LIGATION  1986    FAMILY HISTORY: Family History  Problem Relation Age of Onset   Arthritis Mother    COPD Father    Heart disease Father    Arthritis Sister    COPD Sister     ADVANCED DIRECTIVES (Y/N):  N  HEALTH MAINTENANCE: Social History   Tobacco Use   Smoking status: Some Days    Current packs/day: 0.50    Types: Cigarettes   Smokeless tobacco: Never   Tobacco comments:    Using the patch now, trying to quit - 12/30/22  Vaping Use   Vaping status: Never Used  Substance Use Topics   Alcohol use: Yes    Alcohol/week: 14.0 standard drinks of alcohol    Types: 14 Cans of beer per week    Comment: 2 beers/day - occasionally   Drug use: Not Currently     Colonoscopy:  PAP:  Bone density:  Lipid panel:  Allergies  Allergen Reactions   Erythromycin Nausea And Vomiting  Current Outpatient Medications  Medication Sig Dispense Refill   albuterol (VENTOLIN HFA) 108 (90 Base) MCG/ACT inhaler Inhale 2 puffs into the lungs every 6 (six) hours as needed for wheezing or shortness of breath. 8.5 g 2   buPROPion (WELLBUTRIN SR) 150 MG 12 hr tablet      cetirizine (ZYRTEC) 10 MG tablet Take 10 mg by mouth daily.     cholecalciferol  (VITAMIN D3) 25 MCG (1000 UNIT) tablet Take 1,000 Units by mouth daily.     cyclobenzaprine (FLEXERIL) 10 MG tablet Take 10 mg by mouth 2 (two) times daily as needed.     ibuprofen (ADVIL) 600 MG tablet      naproxen (NAPROSYN) 500 MG tablet      naproxen sodium (ALEVE) 220 MG tablet Take by mouth.     omeprazole (PRILOSEC) 20 MG capsule Take 20 mg by mouth daily.     No current facility-administered medications for this visit.    OBJECTIVE: There were no vitals filed for this visit.    There is no height or weight on file to calculate BMI.    ECOG FS:0 - Asymptomatic  General: Well-developed, well-nourished, no acute distress. HEENT: Normocephalic. Neuro: Alert, answering all questions appropriately. Cranial nerves grossly intact. Psych: Normal affect.  LAB RESULTS:  Lab Results  Component Value Date   NA 137 08/07/2023   K 3.5 08/07/2023   CL 103 08/07/2023   CO2 23 08/07/2023   GLUCOSE 127 (H) 08/07/2023   BUN 11 08/07/2023   CREATININE 0.59 08/07/2023   CALCIUM 9.4 08/07/2023   PROT 7.3 06/12/2023   ALBUMIN 4.7 06/12/2023   AST 34 06/12/2023   ALKPHOS 116 06/12/2023   BILITOT 0.3 06/12/2023   GFRNONAA >60 08/07/2023    Lab Results  Component Value Date   WBC 6.4 08/10/2023   HGB 15.4 (H) 08/10/2023   HCT 45.5 08/10/2023   MCV 95.6 08/10/2023   PLT 264 08/10/2023     STUDIES: DG PAIN CLINIC C-ARM 1-60 MIN NO REPORT  Result Date: 08/08/2023 Fluoro was used, but no Radiologist interpretation will be provided. Please refer to "NOTES" tab for provider progress note.  DG Chest 2 View  Result Date: 08/07/2023 CLINICAL DATA:  Shortness of breath, cough. EXAM: CHEST - 2 VIEW COMPARISON:  January 29, 2012. FINDINGS: The heart size and mediastinal contours are within normal limits. Both lungs are clear. The visualized skeletal structures are unremarkable. IMPRESSION: No active cardiopulmonary disease. Electronically Signed   By: Lupita Raider M.D.   On: 08/07/2023  12:53    ASSESSMENT: Polycythemia.  PLAN:    Polycythemia: Possibly secondary to tobacco use.  Patient's most recent hemoglobin is only mildly elevated at 15.4.  She has a mildly elevated carbon monoxide level that may be contributing.  All of her other laboratory work from today including iron panel, erythropoietin level, and JAK2 mutation with reflex are all either negative or within normal limits.  No intervention is needed at this time.  No further follow-up has been scheduled.  Recommend continue monitoring CBC 1-2 times per year and if her hemoglobin trends back up and remains persistently greater than 17.0, please refer patient back for further evaluation.   I provided 20 minutes of face-to-face video visit time during this encounter which included chart review, counseling, and coordination of care as documented above.    Patient expressed understanding and was in agreement with this plan. She also understands that She can call clinic at any time with  any questions, concerns, or complaints.    Jeralyn Ruths, MD   09/01/2023 11:32 AM

## 2023-09-07 LAB — COLOGUARD: COLOGUARD: POSITIVE — AB

## 2023-09-08 NOTE — Progress Notes (Unsigned)
Sleep Medicine   Office Visit  Patient Name: Cassidy Erickson DOB: [birth year] MRN [id number]    Chief Complaint: concern for sleep apnea  Brief History:  Raquel presents for an initial consult for sleep evaluation and to establish care. Patient has a about 5 year history of insomnia. Sleep quality is fair. This is noted most nights. The patient's bed partner reports  some occasional snoring at night. The patient relates the following symptoms: occasional snoring, excessive daytime sleepiness, difficulty getting to and staying asleep are also present. The patient goes to sleep at 12 am and wakes up at 6 am.  Sleep quality is same when outside home environment.  Patient has noted no restlessness of her legs at night that would disrupt her sleep.  The patient  relates no unusual behavior during the night.  The patient relates no  history of psychiatric problems. The Epworth Sleepiness Score is 7 out of 24 .  The patient relates  Cardiovascular risk factors include: none.  The patient reports her father had sleep apnea.     ROS  General: (-) fever, (-) chills, (-) night sweat Nose and Sinuses: (-) nasal stuffiness or itchiness, (-) postnasal drip, (-) nosebleeds, (-) sinus trouble. Mouth and Throat: (-) sore throat, (-) hoarseness. Neck: (-) swollen glands, (-) enlarged thyroid, (-) neck pain. Respiratory: - cough, - shortness of breath, - wheezing. Neurologic: - numbness, - tingling. Psychiatric: - anxiety, - depression Sleep behavior: -sleep paralysis -hypnogogic hallucinations -dream enactment      -vivid dreams -cataplexy -night terrors -sleep walking   Current Medication: Outpatient Encounter Medications as of 09/11/2023  Medication Sig Note   albuterol (VENTOLIN HFA) 108 (90 Base) MCG/ACT inhaler Inhale 2 puffs into the lungs every 6 (six) hours as needed for wheezing or shortness of breath.    buPROPion (WELLBUTRIN SR) 150 MG 12 hr tablet  07/12/2023: States she is taking PRN     cetirizine (ZYRTEC) 10 MG tablet Take 10 mg by mouth daily.    cholecalciferol (VITAMIN D3) 25 MCG (1000 UNIT) tablet Take 1,000 Units by mouth daily.    cyclobenzaprine (FLEXERIL) 10 MG tablet Take 10 mg by mouth 2 (two) times daily as needed.    ibuprofen (ADVIL) 600 MG tablet     naproxen (NAPROSYN) 500 MG tablet     naproxen sodium (ALEVE) 220 MG tablet Take by mouth.    omeprazole (PRILOSEC) 20 MG capsule Take 20 mg by mouth daily.    No facility-administered encounter medications on file as of 09/11/2023.    Surgical History: Past Surgical History:  Procedure Laterality Date   COLONOSCOPY WITH PROPOFOL N/A 07/03/2019   Procedure: COLONOSCOPY WITH PROPOFOL;  Surgeon: Wyline Mood, MD;  Location: [facility] ENDOSCOPY;  Service: Gastroenterology;  Laterality: N/A;   FEMUR FRACTURE SURGERY Left 2018   TUBAL LIGATION  1986    Medical History: Past Medical History:  Diagnosis Date   Arthritis    GERD (gastroesophageal reflux disease)     Family History: Non contributory to the present illness  Social History: Social History   Socioeconomic History   Marital status: Single    Spouse name: Not on file   Number of children: Not on file   Years of education: Not on file   Highest education level: Not on file  Occupational History   Not on file  Tobacco Use   Smoking status: Some Days    Current packs/day: 0.50    Types: Cigarettes   Smokeless tobacco: Never  Tobacco comments:    Using the patch now, trying to quit - 12/30/22  Vaping Use   Vaping status: Never Used  Substance and Sexual Activity   Alcohol use: Yes    Alcohol/week: 14.0 standard drinks of alcohol    Types: 14 Cans of beer per week    Comment: 2 beers/day - occasionally   Drug use: Not Currently   Sexual activity: Not Currently    Birth control/protection: Surgical  Other Topics Concern   Not on file  Social History Narrative   Not on file   Social Determinants of Health   Financial Resource Strain:  Not on file  Food Insecurity: Food Insecurity Present (08/10/2023)   Hunger Vital Sign    Worried About Running Out of Food in the Last Year: Sometimes true    Ran Out of Food in the Last Year: Sometimes true  Transportation Needs: No Transportation Needs (08/10/2023)   PRAPARE - Administrator, Civil Service (Medical): No    Lack of Transportation (Non-Medical): No  Physical Activity: Not on file  Stress: Not on file  Social Connections: Not on file  Intimate Partner Violence: Not At Risk (08/10/2023)   Humiliation, Afraid, Rape, and Kick questionnaire    Fear of Current or Ex-Partner: No    Emotionally Abused: No    Physically Abused: No    Sexually Abused: No    Vital Signs: Blood pressure 130/86, pulse 88, resp. rate 14, height 5\' 5"  (1.651 m), weight 125 lb (56.7 kg), SpO2 96%. Body mass index is 20.8 kg/m.   Examination: General Appearance: The patient is well-developed, well-nourished, and in no distress. Neck Circumference: 32 cm Skin: Gross inspection of skin unremarkable. Head: normocephalic, no gross deformities. Eyes: no gross deformities noted. ENT: ears appear grossly normal Neurologic: Alert and oriented. No involuntary movements.    STOP BANG RISK ASSESSMENT S (snore) Have you been told that you snore?     YES   T (tired) Are you often tired, fatigued, or sleepy during the day?   YES  O (obstruction) Do you stop breathing, choke, or gasp during sleep? NO   P (pressure) Do you have or are you being treated for high blood pressure? NO   B (BMI) Is your body index greater than 35 kg/m? NO   A (age) Are you 56 years old or older? YES   N (neck) Do you have a neck circumference greater than 16 inches?   NO   G (gender) Are you a female? NO   TOTAL STOP/BANG "YES" ANSWERS 3                                                               A STOP-Bang score of 2 or less is considered low risk, and a score of 5 or more is high risk for having either  moderate or severe OSA. For people who score 3 or 4, doctors may need to perform further assessment to determine how likely they are to have OSA.         EPWORTH SLEEPINESS SCALE:  Scale:  (0)= no chance of dozing; (1)= slight chance of dozing; (2)= moderate chance of dozing; (3)= high chance of dozing  Chance  Situtation    Sitting and reading:  2    Watching TV: 1    Sitting Inactive in public: 0    As a passenger in car: 2      Lying down to rest: 1    Sitting and talking: 0    Sitting quielty after lunch: 1    In a car, stopped in traffic: 0   TOTAL SCORE:   7 out of 24    SLEEP STUDIES:  None   LABS: Recent Results (from the past 2160 hour(s))  Basic metabolic panel     Status: Abnormal   Collection Time: 08/07/23 11:05 AM  Result Value Ref Range   Sodium 137 135 - 145 mmol/L   Potassium 3.5 3.5 - 5.1 mmol/L   Chloride 103 98 - 111 mmol/L   CO2 23 22 - 32 mmol/L   Glucose, Bld 127 (H) 70 - 99 mg/dL    Comment: Glucose reference range applies only to samples taken after fasting for at least 8 hours.   BUN 11 8 - 23 mg/dL   Creatinine, Ser 2.54 0.44 - 1.00 mg/dL   Calcium 9.4 8.9 - 27.0 mg/dL   GFR, Estimated >62 >37 mL/min    Comment: (NOTE) Calculated using the CKD-EPI Creatinine Equation (2021)    Anion gap 11 5 - 15    Comment: Performed at [facility], [address] [place], [place] [address]  CBC     Status: Abnormal   Collection Time: 08/07/23 11:05 AM  Result Value Ref Range   WBC 5.5 4.0 - 10.5 K/uL   RBC 5.10 3.87 - 5.11 MIL/uL   Hemoglobin 16.5 (H) 12.0 - 15.0 g/dL   HCT 51.7 (H) 61.6 - 07.3 %   MCV 96.1 80.0 - 100.0 fL   MCH 32.4 26.0 - 34.0 pg   MCHC 33.7 30.0 - 36.0 g/dL   RDW 71.0 62.6 - 94.8 %   Platelets 261 150 - 400 K/uL   nRBC 0.0 0.0 - 0.2 %    Comment: Performed at [facility] [address] [place], [place] [address]  Erythropoietin     Status: None   Collection Time: 08/10/23 11:45 AM  Result  Value Ref Range   Erythropoietin 3.2 2.6 - 18.5 mIU/mL    Comment: (NOTE) [employer] UniCel DxI 800 Immunoassay System Values obtained with different assay methods or kits cannot be used interchangeably. Results cannot be interpreted as absolute evidence of the presence or absence of malignant disease. Performed At: [facility] [address] [place], [place] [id number] Jolene Schimke MD WE:[id number]   CBC (Cancer Center Only)     Status: Abnormal   Collection Time: 08/10/23 11:45 AM  Result Value Ref Range   WBC Count 6.4 4.0 - 10.5 K/uL   RBC 4.76 3.87 - 5.11 MIL/uL   Hemoglobin 15.4 (H) 12.0 - 15.0 g/dL   HCT 93.8 10.1 - 75.1 %   MCV 95.6 80.0 - 100.0 fL   MCH 32.4 26.0 - 34.0 pg   MCHC 33.8 30.0 - 36.0 g/dL   RDW 02.5 85.2 - 77.8 %   Platelet Count 264 150 - 400 K/uL   nRBC 0.0 0.0 - 0.2 %    Comment: Performed at [facility], [address] [place], [place] [address]  Carbon monoxide, blood (performed at ref lab)     Status: Abnormal   Collection Time: 08/10/23 11:45 AM  Result Value Ref Range   Carbon Monoxide, Blood 5.1 (H) 0.0 - 3.6 %  Comment: (NOTE)                            Environmental Exposure:                             Nonsmokers           <3.7                             Smokers              <9.9                            Occupational Exposure:                             BEI                   3.5                                Detection Limit =  0.2 Performed At: [facility] [employer] [address] [place], [place] [id number] Jolene Schimke MD [id number]   JAK2 V617F rfx CALR/MPL/E12-15     Status: None   Collection Time: 08/10/23 11:45 AM  Result Value Ref Range   JAK2 V617F Result Comment     Comment: (NOTE) NEGATIVE The JAK2 V617F mutation is not detected in the provided specimen of this individual. Results should be interpreted in conjunction with clinical and other laboratory findings for the most  accurate interpretation. This test was developed and its performance characteristics determined by [facility]. It has not been cleared or approved by the Food and Drug Administration.    Reflex Comment     Comment: (NOTE) Reflex to CALR Mutation Analysis, JAK2 Exon 12-15 Mutation Analysis, and MPL Mutation Analysis is indicated.    V617F Rfx CALR/MPL/E12-15 Bkgd Comment     Comment: (NOTE)    Molecular testing of blood or bone marrow is useful in the evaluation of suspected myeloproliferative neoplasms (MPN). Mutations in the JAK2, MPL, and CALR genes are present in virtually all MPNs and their presence help distinguish benign reactive processes from clonal neoplasms. These mutations are generally considered mutually exclusive, although concurrent clones have been reported in rare patients. This test will assess for the JAK2V617F (exon 14) mutation first and will reflex to CALR mutation analysis, MPL mutation analysis, and JAK2 exon 12 to 15 mutation analysis if the JAK2V617F mutation is negative.    The JAK2 (Janus kinase 2) gene encodes for a non-receptor protein tyrosine kinase that activates cytokine and growth factor signaling. The V617F (c.1849 G>T) mutation results in constitutive activation of JAK2 and downstream STAT5 and ERK signaling. The V617F mutation is observed in approximately 95% of polycythemia vera (PV), 60% of essential thromboc ythemia (ET) and primary myelofibrosis (PMF). It is also infrequently present (3-5%) in myelodysplastic syndrome, chronic myelomonocytic leukemia, and other atypical chronic myeloid disorders. A small percentage of JAK2 mutation positive patients (3.3%) contain other non-V617F mutations within exons 12 to 15. In particular, mutations in exon 12 of JAK2 have been described in approximately 3% of patients with PV. JAK2 allele burden correlates with clinical phenotype, with low levels of mutant  allele characterized by thrombocytosis,  intermediate levels with erythrocytosis, and high mutant allele burden correlating with enhanced myelopoiesis of the BM, leukocytosis, increasing spleen size, and circulating CD34-positive cells.    The CALR (Calreticulin) gene encodes for a multifunctional calcium-binding protein involved in many cellular activities such as growth, proliferation, adhesion, and programmed cell death. Among patients with JAK2 negative MPNs, CALR are found in a pproximately 70% of patients with JAK2-negative essential thrombocythemia (ET) and 60-88% of patients with JAK2-negative primary myelofibrosis(PMF). Only a minority of patients (approximately 8%) with myelodysplasia have mutations in the CALR gene. CALR mutations are rarely detected in patients with de novo acute myeloid leukemia, chronic myelogenous leukemia, lymphoid leukemia, or solid tumors. CALR mutations are not detected in polycythemia and generally appear to be mutually exclusive with JAK2 mutations and MPL mutations. The majority of mutational changes involve a variety of insertion deletion mutations in exon 9 of the calreticulin gene: approximately 53% of all CALR mutations are a 52 bp deletion (type-1) while the second most prevalent mutation (approximately 32%) contains a 5 bp insertion (type-2). Other mutations (non-type 1 or type 2) are seen in a small minority of cases. CALR mutations in PMF tend to be with a favorable prognosis compared to JAK2 V617F [employer], whereas primary myelofibrosis negative for CALR, JAK2 V617F and MPL mutations (so-called triple negative) is associated with a poor prognosis and shorter survival.    The MPL (myeloproliferative leukemia virus oncogene) gene encodes the thrombopoietin receptor which regulates hematopoiesis and megakaryopoiesis. Activating MPL mutations are associated with a subset of myeloproliferative neoplasms and acute megakaryoblastic leukemia. MPL W515 mutations are present  in approximately 5-8% of patients with primary myelofibrosis (PMF) and 1-4% of patients with essential thrombocythemia (ET). The S505 mutation is detected in patients with hereditary thrombocythemia.    Limitations    This assay has a sensitivity of approximately 1% VAF for JAK2 V617F, 2.5% VAF for other mutations in JAK2 exons 12 to 15, CALR mutations, and MPL   mutations. Deletions in CALR up to 70 bp, insertions up to 12 bp, and deletion-insertions (delins) of net length -13 to +11 have been detected  in validation studies.    Method based next generation sequencing.     Comment: Comment [employer]    References Comment     Comment: (NOTE) Alghasham N, Alnouri Y, Abalkhail H, Clarita Leber. Detection of mutations in JAK2 exons 12-15 by [employer] sequencing. Int J Lab Hematol. 2016 Feb;38(1):34-41. doi: 10.1111/ijlh.16109. Epub 2015 Sep 11. PMID: 60454098. Pura Spice, Unk Lightning, Hasserjian R, Patrica Duel, Borowitz MJ, Leroy Libman MM, [place] CD, [place], Vardiman JW. The 2016 revision to the World Health Organization classification of myeloid neoplasms and acute leukemia. Blood. 2016 May 19;127(20):2391-405. doi: 10.1182/blood-2016-03-643544. Epub 2016 Apr 11. PMID: 11914782. Genevie Ann [facility], Zhang ZJ, Gramling S, Albitar M. Mutation profile of JAK2 transcripts in patients with chronic myeloproliferative neoplasias. J Mol Diagn. 2009 Jan;11(1):49-53.doi: 10.2353/jmoldx.2009.080114. Epub 2008 Dec 12. PMID: 95621308; PMCID: MVH8469629. NCCN Clinical Practice Guidelines in Oncology (NCCN Guidelines) Myeloproliferative Neoplasms Version 3.2022 - June 24, 2021. Swerdlow SH, Programmer, multimedia. WHO classif ication of Tumours of Haematopoietic and Lymphoid Tissues. 4th edn. Jaci Standard, [place]: Geologist, engineering for [employer] on [employer]; 2017. Tefferi A. Primary myelofibrosis: 2021 update on diagnosis, risk-stratification and management. Am J Hematol. 2021 Jan;96(1):145-162. doi:  10.1002/ajh.26050. Epub 2020 Dec 2. PMID: 52841324. Royetta Car, Kralovics R. Genetic basis and molecular pathophysiology of classical myeloproliferative neoplasms. Blood. 2017 Feb 9;129(6):667-679. doi: 10.1182/blood-2016-10-695940. Epub  2016 Dec 27. PMID: 82956213.    Director Review Comment     Comment: (NOTE) Les Pou, PhD, [facility] Director, Molecular Oncology [facility] [address] [place], [place] [address] [phone] Performed At: [facility] [address] [place], [place] [id number] Maurine Simmering MDPhD VO:[id number] Performed At: [facility] [address] [place], [place] [id number] Maurine Simmering MDPhD [id number]   Iron and TIBC     Status: Abnormal   Collection Time: 08/10/23 11:45 AM  Result Value Ref Range   Iron 144 28 - 170 ug/dL   TIBC 166 063 - 016 ug/dL   Saturation Ratios 39 (H) 10.4 - 31.8 %   UIBC 228 ug/dL    Comment: Performed at [facility], [address] [place], [place] [address]  Ferritin     Status: None   Collection Time: 08/10/23 11:45 AM  Result Value Ref Range   Ferritin 107 11 - 307 ng/mL    Comment: Performed at [facility], [address] [place], [place] [address]  CALR +MPL + E12-E15 (reflexed)     Status: None   Collection Time: 08/10/23 11:45 AM  Result Value Ref Range   CALR Result Comment     Comment: (NOTE) NEGATIVE No insertions or deletions were detected within the analyzed region of the calreticulin (CALR) gene. A negative result does not entirely exclude the possibility of a clonal population carrying CALR gene mutations that are not covered by this assay. Results should be interpreted in conjunction with clinical and laboratory findings for the most accurate interpretation.    MPL Result Comment     Comment: (NOTE) NEGATIVE No MPL mutation was identified in the provided specimen of this individual. Results should be interpreted in conjunction  with clinical and other laboratory findings for the most accurate interpretation.    E12-15 Result Comment     Comment: (NOTE) NEGATIVE JAK2 mutations were not detected in exons 12, 13, 14 and 15. This result does not rule out the presence of JAK2 mutation at a level below the detection sensitivity of this assay, the presence of other mutations outside the analyzed region of the JAK2 gene, or the presence of a myeloproliferative or other neoplasm. Result must be correlated with other clinical data for the most accurate diagnosis. Performed At: [facility] [address] [place], [place] [id number] Maurine Simmering MDPhD CW:[id number]     Radiology: [facility] PAIN [facility]IN[facility] C-ARM 1-60 MIN NO REPORT  Result Date: 08/08/2023 Fluoro was used, but no Radiologist interpretation will be provided. Please refer to "NOTES" tab for provider progress note.   No results found.  No results found.    Assessment and Plan: Patient Active Problem List   Diagnosis Date Noted   Lumbar facet joint syndrome (Bilateral) 07/12/2023   Spondylosis without myelopathy or radiculopathy, lumbosacral region 07/12/2023   Allergy 06/12/2023   Closed displaced intertrochanteric fracture of left femur (HCC) 06/12/2023   Chronic pain syndrome 06/12/2023   Pharmacologic therapy 06/12/2023   Disorder of skeletal system 06/12/2023   Problems influencing health status 06/12/2023   Long term current use of non-steroidal anti-inflammatories (NSAID) 06/12/2023   DDD (degenerative disc disease), lumbar 06/12/2023   Grade 1 Anterolisthesis of lumbar spine (L4/L5) (Dynamic) 06/12/2023   Closed fracture of inferior pubic ramus, sequela (Left) 06/12/2023   Chronic low back pain (1ry area of Pain) (Bilateral) w/o sciatica 06/12/2023   Chronic hip pain (2ry area of Pain) (  Bilateral) 06/12/2023   Chronic lower extremity pain (3ry area of Pain) (Right) 06/12/2023   Lumbar facet joint pain 06/12/2023   History of colonic  polyps    Polyp of colon    Tobacco use disorder 08/31/2017   Sacroiliac pain 01/27/2017   Degenerative scoliosis in adult patient 03/12/2015   Lumbosacral facet joint arthritis 03/12/2015   Osteoarthritis of hip (Right) 03/12/2015   Radiculopathy of lumbar region 03/12/2015   Spondylolisthesis of lumbar region 03/12/2015   1. Erythrocytosis Patient evaluation suggests high risk of sleep disordered breathing due to family history of OSA in father, snoring, daytime sleepiness, frequent awakening and erythrocytosis.   Suggest: PSG to assess/treat the patient's sleep disordered breathing. The patient was also counselled on sleep hygiene to optimize sleep health.   2. Insomnia, unspecified type Discussed sleep hygiene. This seems to be the biggest issue.  Methods to improve your sleep habits -Keep a consistent wind down period before your bedtime to help you relax. Do not do work-related activities around bedtime.  -Go to bed only when you are sleepy.  -Get out of bed at the same time every morning, even on holidays or weekends.  -If you haven't fallen asleep after lying in bed in 20 minutes, do something boring and relaxing with minimal light expo sure outside the bedroom. Return to bed when you are sleepy.  -While in bed, turn the clock away from you. It'll only remind you of something you probably don't want to know.  -No napping during the day, especially after 3 pm. If a nap is unavoidable, keep it under an hour.  -Your bedroom should be quiet and dark.  -Take a warm, relaxing bath or shower! A warm bath or shower prior to bedtime increasesyour body's temperature so the subsequent cooling off period can help induce sleep.  -Exercise regularly, but not tough exercise within 6 hours or bedtime.  -Avoid eating large or fatty meals near bedtime.  -Avoid caffeine after lunch.    1 - Portions taken from http://[email or web address].fr.aspx     General Counseling: I have  discussed the findings of the evaluation and examination with Marai.  I have also discussed any further diagnostic evaluation thatmay be needed or ordered today. Lassie verbalizes understanding of the findings of todays visit. We also reviewed her medications today and discussed drug interactions and side effects including but not limited excessive drowsiness and altered mental states. We also discussed that there is always a risk not just to her but also people around her. she has been encouraged to call the office with any questions or concerns that should arise related to todays visit.  No orders of the defined types were placed in this encounter.       I have personally obtained a history, evaluated the patient, evaluated pertinent data, formulated the assessment and plan and placed orders.  This patient was seen today by Emmaline Kluver, PA-C in collaboration with Dr. Freda Munro.    Yevonne Pax, MD [facility] Diplomate ABMS Pulmonary and Critical Care Medicine Sleep medicine

## 2023-09-11 ENCOUNTER — Ambulatory Visit (INDEPENDENT_AMBULATORY_CARE_PROVIDER_SITE_OTHER): Payer: Self-pay | Admitting: Internal Medicine

## 2023-09-11 VITALS — BP 130/86 | HR 88 | Resp 14 | Ht 65.0 in | Wt 125.0 lb

## 2023-09-11 DIAGNOSIS — D751 Secondary polycythemia: Secondary | ICD-10-CM | POA: Diagnosis not present

## 2023-09-11 DIAGNOSIS — G4700 Insomnia, unspecified: Secondary | ICD-10-CM

## 2023-09-13 ENCOUNTER — Ambulatory Visit (INDEPENDENT_AMBULATORY_CARE_PROVIDER_SITE_OTHER): Payer: Medicare HMO | Admitting: Pulmonary Disease

## 2023-09-13 ENCOUNTER — Encounter: Payer: Self-pay | Admitting: Pulmonary Disease

## 2023-09-13 VITALS — BP 120/60 | HR 69 | Temp 97.8°F | Ht 65.0 in | Wt 129.6 lb

## 2023-09-13 DIAGNOSIS — R911 Solitary pulmonary nodule: Secondary | ICD-10-CM

## 2023-09-13 DIAGNOSIS — R053 Chronic cough: Secondary | ICD-10-CM | POA: Diagnosis not present

## 2023-09-13 DIAGNOSIS — R058 Other specified cough: Secondary | ICD-10-CM | POA: Diagnosis not present

## 2023-09-13 DIAGNOSIS — J432 Centrilobular emphysema: Secondary | ICD-10-CM

## 2023-09-13 DIAGNOSIS — F17200 Nicotine dependence, unspecified, uncomplicated: Secondary | ICD-10-CM | POA: Diagnosis not present

## 2023-09-13 LAB — NITRIC OXIDE: Nitric Oxide: 86

## 2023-09-13 MED ORDER — FLUTICASONE FUROATE-VILANTEROL 200-25 MCG/ACT IN AEPB: 1.0000 | INHALATION_SPRAY | Freq: Every day | RESPIRATORY_TRACT | 3 refills | Status: AC

## 2023-09-13 NOTE — Progress Notes (Signed)
Synopsis: Referred in by [facility]   Subjective:   PATIENT ID: Cassidy Erickson GENDER: female DOB: 1961/09/23, MRN: [id number]  Chief Complaint  Patient presents with   pulmonary consult    Coughing spell in the morning- cough is prod with clear sputum.    HPI Cassidy Erickson is a pleasant 62 years old female patient with a past medical history of GERD, Asthma as a child, seasonal allergies presenting to the pulmonary clinic for a chronic cough.   She reports that she cut down on smoking significantly about 6 months ago and started noticing woresneing cough with sputum production. This was deemed secondary to backing on smoking. However this persisted and she presented to an urgent care clinic in september and was prescribed prednisone which made her feel significantly better. Chest X-ray done in septemeber with hyperinflation of the lungs slight flattening of the diaphragms.   Today she reports feeling slightly better but with persistent cough specifically in the morning. She does report DOE and intermittent wheezing.   Of note, she is enrolled in the LDCT program at [facility], CT chest in 2022 with LLL perifissural nodule. CT chest in 2023 mentions a stable 0.7 cm RUL anterior nodule along with stable left lower lobe nodule.   Family history -  Both Father and brother with emphysema.   Social History - Active smoker, smokes 1PPD for 40 years. Drinks couple of beers every night. Denies illicit drug use. Lives home alone and has a dog.   ROS All systems were reviewed and are negative except for the above.  Objective:   Vitals:   09/13/23 0839  BP: 120/60  Pulse: 69  Temp: 97.8 F (36.6 C)  TempSrc: Oral  SpO2: 98%  Weight: 129 lb 9.6 oz (58.8 kg)  Height: 5\' 5"  (1.651 m)   98% on RA BMI Readings from Last 3 Encounters:  09/13/23 21.57 kg/m  09/11/23 20.80 kg/m  08/22/23 20.80 kg/m   Wt Readings from Last 3 Encounters:  09/13/23 129 lb 9.6 oz (58.8 kg)   09/11/23 125 lb (56.7 kg)  08/22/23 125 lb (56.7 kg)    Physical Exam GEN: NAD, Healthy Appearing HEENT: Supple Neck, Reactive Pupils, EOMI  CVS: Normal S1, Normal S2, RRR, No murmurs or ES appreciated  Lungs: Clear bilateral air entry.  Abdomen: Soft, non tender, non distended, + BS  Extremities: Warm and well perfused, No edema  Skin: No suspicious lesions appreciated  Psych: Normal Affect  Ancillary Information   CBC    Component Value Date/Time   WBC 6.4 08/10/2023 1145   WBC 5.5 08/07/2023 1105   RBC 4.76 08/10/2023 1145   HGB 15.4 (H) 08/10/2023 1145   HCT 45.5 08/10/2023 1145   PLT 264 08/10/2023 1145   MCV 95.6 08/10/2023 1145   MCH 32.4 08/10/2023 1145   MCHC 33.8 08/10/2023 1145   RDW 11.9 08/10/2023 1145   Labs and imaging were reviewed.      No data to display           Assessment & Plan:  Cassidy Erickson is a pleasant 62 years old female patient with a past medical history of GERD, Asthma as a child, seasonal allergies presenting to the pulmonary clinic for a chronic cough.   #Productive cough and DOE  Highly suggestive of COPD Asthma overalp. Feno in office 82.   []  PFTs  []  Start Fluticasone-Vilanterol [Breo Ellipta] 1 puff daily. Advised mouth rinsing after each use.  []  C/w Albuterol  2puffs Q6H as needed.   #RUL lung nodule measuring 0.7cm in August 2023.  Location is concerning and she is high risk.  []  Will repeat Ct chest in Feb 2025. (6 months post prior)   #Tobacco Use Disorder  Advised complete abstinence. She tells me she is trying and cut back significantly. Has NRT at home.   Return in about 3 months (around 12/14/2023).  I spent 60 minutes caring for this patient today, including preparing to see the patient, obtaining a medical history , reviewing a separately obtained history, performing a medically appropriate examination and/or evaluation, counseling and educating the patient/family/caregiver, ordering medications, tests, or  procedures, documenting clinical information in the electronic health record, and independently interpreting results (not separately reported/billed) and communicating results to the patient/family/caregiver  Janann Colonel, MD [facility] 09/13/2023 9:48 AM

## 2023-09-19 ENCOUNTER — Ambulatory Visit: Payer: Medicare HMO | Admitting: Podiatry

## 2023-09-19 DIAGNOSIS — Q828 Other specified congenital malformations of skin: Secondary | ICD-10-CM

## 2023-09-19 NOTE — Progress Notes (Signed)
  Subjective:  Patient ID: Cassidy Erickson, female    DOB: [birth year],  MRN: [id number]  No chief complaint on file.   62 y.o. female presents with the above complaint.  Patient presents with complaint left submetatarsal 5 porokeratotic lesion painful to touch is progressive gotten worse hurts with ambulation hurts with pressure.  She would like to discuss treatment options for it she has not seen anyone else prior to seeing me for this.  Pain scale 7 out of 10 dull achy in nature.   Review of Systems: Negative except as noted in the HPI. Denies N/V/F/Ch.  Past Medical History:  Diagnosis Date   Arthritis    GERD (gastroesophageal reflux disease)     Current Outpatient Medications:    albuterol (VENTOLIN HFA) 108 (90 Base) MCG/ACT inhaler, Inhale 2 puffs into the lungs every 6 (six) hours as needed for wheezing or shortness of breath., Disp: 8.5 g, Rfl: 2   buPROPion (WELLBUTRIN SR) 150 MG 12 hr tablet, , Disp: , Rfl:    cetirizine (ZYRTEC) 10 MG tablet, Take 10 mg by mouth daily., Disp: , Rfl:    cholecalciferol (VITAMIN D3) 25 MCG (1000 UNIT) tablet, Take 1,000 Units by mouth daily., Disp: , Rfl:    cyclobenzaprine (FLEXERIL) 10 MG tablet, Take 10 mg by mouth 2 (two) times daily as needed., Disp: , Rfl:    fluticasone furoate-vilanterol (BREO ELLIPTA) 200-25 MCG/ACT AEPB, Inhale 1 puff into the lungs daily., Disp: 3 each, Rfl: 3   ibuprofen (ADVIL) 600 MG tablet, , Disp: , Rfl:    naproxen (NAPROSYN) 500 MG tablet, , Disp: , Rfl:    naproxen sodium (ALEVE) 220 MG tablet, Take by mouth., Disp: , Rfl:    omeprazole (PRILOSEC) 20 MG capsule, Take 20 mg by mouth daily., Disp: , Rfl:   Social History   Tobacco Use  Smoking Status Some Days   Current packs/day: 0.50   Types: Cigarettes  Smokeless Tobacco Never  Tobacco Comments   Using the patch now, trying to quit - 1 pack with last 2 days-09/13/2023    Allergies  Allergen Reactions   Erythromycin Nausea And Vomiting    Objective:   There were no vitals filed for this visit.  There is no height or weight on file to calculate BMI. Constitutional Well developed. Well nourished.  Vascular Dorsalis pedis pulses palpable bilaterally. Posterior tibial pulses palpable bilaterally. Capillary refill normal to all digits.  No cyanosis or clubbing noted. Pedal hair growth normal.  Neurologic Normal speech. Oriented to person, place, and time. Epicritic sensation to light touch grossly present bilaterally.  Dermatologic Left submetatarsal 5 porokeratotic lesion.  Painful to touch plantarflexed fifth metatarsal noted.  Lesion debrided down.  No pinpoint bleeding noted  Orthopedic: Normal joint ROM without pain or crepitus bilaterally. No visible deformities. No bony tenderness.   Radiographs: None Assessment:   1. Porokeratosis     Plan:  Patient was evaluated and treated and all questions answered.  Left submetatarsal 5 porokeratosis -All questions and concerns were discussed with the patient in extensive detail given the amount of pain that she is having as a courtesy the lesion was debrided down to healthy striated tissue no complication noted no pinpoint bleeding noted.  I discussed shoe gear modification padding offloading.  No follow-ups on file.

## 2023-09-27 ENCOUNTER — Telehealth: Payer: Self-pay

## 2023-09-27 NOTE — Telephone Encounter (Signed)
PT REQUESTING CALL BACK TO SCHEDULE COLONOSCOPY

## 2023-09-28 ENCOUNTER — Other Ambulatory Visit: Payer: Self-pay

## 2023-09-28 ENCOUNTER — Telehealth: Payer: Self-pay

## 2023-09-28 DIAGNOSIS — R195 Other fecal abnormalities: Secondary | ICD-10-CM

## 2023-09-28 DIAGNOSIS — Z860100 Personal history of colon polyps, unspecified: Secondary | ICD-10-CM

## 2023-09-28 MED ORDER — NA SULFATE-K SULFATE-MG SULF 17.5-3.13-1.6 GM/177ML PO SOLN: 1.0000 | Freq: Once | ORAL | 0 refills | Status: AC

## 2023-09-28 NOTE — Telephone Encounter (Signed)
Gastroenterology Pre-Procedure Review  Request Date: 10/18/23 Requesting Physician: Dr. Tobi Bastos  PATIENT REVIEW QUESTIONS: The patient responded to the following health history questions as indicated:    1. Are you having any GI issues? no gi symptoms, however positive cologuard 2. Do you have a personal history of Polyps? yes (history of colon polyps last colonoscopy 07/03/19) 3. Do you have a family history of Colon Cancer or Polyps? no 4. Diabetes Mellitus? no 5. Joint replacements in the past 12 months?no 6. Major health problems in the past 3 months?no 7. Any artificial heart valves, MVP, or defibrillator?no    MEDICATIONS & ALLERGIES:    Patient reports the following regarding taking any anticoagulation/antiplatelet therapy:   Plavix, Coumadin, Eliquis, Xarelto, Lovenox, Pradaxa, Brilinta, or Effient? no Aspirin? no  Patient confirms/reports the following medications:  Current Outpatient Medications  Medication Sig Dispense Refill   albuterol (VENTOLIN HFA) 108 (90 Base) MCG/ACT inhaler Inhale 2 puffs into the lungs every 6 (six) hours as needed for wheezing or shortness of breath. 8.5 g 2   buPROPion (WELLBUTRIN SR) 150 MG 12 hr tablet      cetirizine (ZYRTEC) 10 MG tablet Take 10 mg by mouth daily.     cholecalciferol (VITAMIN D3) 25 MCG (1000 UNIT) tablet Take 1,000 Units by mouth daily.     cyclobenzaprine (FLEXERIL) 10 MG tablet Take 10 mg by mouth 2 (two) times daily as needed.     fluticasone furoate-vilanterol (BREO ELLIPTA) 200-25 MCG/ACT AEPB Inhale 1 puff into the lungs daily. 3 each 3   ibuprofen (ADVIL) 600 MG tablet      naproxen (NAPROSYN) 500 MG tablet      naproxen sodium (ALEVE) 220 MG tablet Take by mouth.     omeprazole (PRILOSEC) 20 MG capsule Take 20 mg by mouth daily.     No current facility-administered medications for this visit.    Patient confirms/reports the following allergies:  Allergies  Allergen Reactions   Erythromycin Nausea And Vomiting     No orders of the defined types were placed in this encounter.   AUTHORIZATION INFORMATION Primary Insurance: 1D#: Group #:  Secondary Insurance: 1D#: Group #:  SCHEDULE INFORMATION: Date: 10/18/23 Time: Location: [facility]

## 2023-09-28 NOTE — Telephone Encounter (Signed)
Okay thank u

## 2023-10-18 ENCOUNTER — Ambulatory Visit
Admission: RE | Admit: 2023-10-18 | Discharge: 2023-10-18 | Disposition: A | Discharge status: Home / Self Care | Payer: Medicare HMO | Attending: Gastroenterology | Admitting: Gastroenterology

## 2023-10-18 ENCOUNTER — Encounter: Payer: Self-pay | Admitting: Gastroenterology

## 2023-10-18 ENCOUNTER — Ambulatory Visit: Payer: Medicare HMO | Admitting: Registered Nurse

## 2023-10-18 ENCOUNTER — Encounter
Admission: RE | Disposition: A | Discharge status: Home / Self Care | Payer: Self-pay | Source: Home / Self Care | Attending: Gastroenterology

## 2023-10-18 DIAGNOSIS — K635 Polyp of colon: Secondary | ICD-10-CM

## 2023-10-18 DIAGNOSIS — Z1211 Encounter for screening for malignant neoplasm of colon: Secondary | ICD-10-CM | POA: Diagnosis not present

## 2023-10-18 DIAGNOSIS — M1990 Unspecified osteoarthritis, unspecified site: Secondary | ICD-10-CM | POA: Insufficient documentation

## 2023-10-18 DIAGNOSIS — D125 Benign neoplasm of sigmoid colon: Secondary | ICD-10-CM | POA: Diagnosis not present

## 2023-10-18 DIAGNOSIS — K219 Gastro-esophageal reflux disease without esophagitis: Secondary | ICD-10-CM | POA: Insufficient documentation

## 2023-10-18 DIAGNOSIS — Z860100 Personal history of colon polyps, unspecified: Secondary | ICD-10-CM

## 2023-10-18 DIAGNOSIS — F17210 Nicotine dependence, cigarettes, uncomplicated: Secondary | ICD-10-CM | POA: Insufficient documentation

## 2023-10-18 HISTORY — PX: COLONOSCOPY WITH PROPOFOL: SHX5780

## 2023-10-18 HISTORY — PX: BIOPSY: SHX5522

## 2023-10-18 SURGERY — COLONOSCOPY WITH PROPOFOL
Anesthesia: General

## 2023-10-18 MED ORDER — SODIUM CHLORIDE 0.9 % IV SOLN: INTRAVENOUS | Status: DC

## 2023-10-18 MED ORDER — LIDOCAINE HCL (CARDIAC) PF 100 MG/5ML IV SOSY
PREFILLED_SYRINGE | INTRAVENOUS | Status: DC | PRN
  Administered 2023-10-18: 20 mg via INTRAVENOUS

## 2023-10-18 MED ORDER — PROPOFOL 10 MG/ML IV BOLUS
INTRAVENOUS | Status: DC | PRN
  Administered 2023-10-18: 20 mg via INTRAVENOUS
  Administered 2023-10-18: 80 mg via INTRAVENOUS

## 2023-10-18 MED ORDER — PROPOFOL 500 MG/50ML IV EMUL
INTRAVENOUS | Status: DC | PRN
  Administered 2023-10-18: 125 ug/kg/min via INTRAVENOUS

## 2023-10-18 NOTE — H&P (Signed)
Wyline Mood, MD [address], [address], [place], [place], [address] [address], [place], [place], [address] Phone: [phone]  Fax: [phone]  Primary Care Physician:  [facility]   Pre-Procedure History & Physical: HPI:  Cassidy Erickson is a 62 y.o. female is here for an colonoscopy.   Past Medical History:  Diagnosis Date   Arthritis    GERD (gastroesophageal reflux disease)     Past Surgical History:  Procedure Laterality Date   COLONOSCOPY WITH PROPOFOL N/A 07/03/2019   Procedure: COLONOSCOPY WITH PROPOFOL;  Surgeon: Wyline Mood, MD;  Location: [facility] ENDOSCOPY;  Service: Gastroenterology;  Laterality: N/A;   FEMUR FRACTURE SURGERY Left 2018   TUBAL LIGATION  1986    Prior to Admission medications   Medication Sig Start Date End Date Taking? Authorizing Provider  albuterol (VENTOLIN HFA) 108 (90 Base) MCG/ACT inhaler Inhale 2 puffs into the lungs every 6 (six) hours as needed for wheezing or shortness of breath. 08/07/23   Trinna Post, MD  buPROPion [facility] SR) 150 MG 12 hr tablet  02/23/22   [provider]  cetirizine (ZYRTEC) 10 MG tablet Take 10 mg by mouth daily. 08/01/23   [provider]  cholecalciferol (VITAMIN D3) 25 MCG (1000 UNIT) tablet Take 1,000 Units by mouth daily.    [provider]  cyclobenzaprine (FLEXERIL) 10 MG tablet Take 10 mg by mouth 2 (two) times daily as needed. 08/01/23   [provider]  fluticasone furoate-vilanterol (BREO ELLIPTA) 200-25 MCG/ACT AEPB Inhale 1 puff into the lungs daily. 09/13/23   Janann Colonel, MD  ibuprofen (ADVIL) 600 MG tablet  12/08/21   [provider]  naproxen (NAPROSYN) 500 MG tablet  02/23/22   [provider]  naproxen sodium (ALEVE) 220 MG tablet Take by mouth.    [provider]  omeprazole (PRILOSEC) 20 MG capsule Take 20 mg by mouth daily.    [provider]    Allergies as of 09/28/2023 - Review  Complete 09/13/2023  Allergen Reaction Noted   Erythromycin Nausea And Vomiting 08/21/2017    Family History  Problem Relation Age of Onset   Arthritis Mother    COPD Father    Heart disease Father    Arthritis Sister    COPD Sister     Social History   Socioeconomic History   Marital status: Single    Spouse name: Not on file   Number of children: Not on file   Years of education: Not on file   Highest education level: Not on file  Occupational History   Not on file  Tobacco Use   Smoking status: Some Days    Current packs/day: 0.50    Types: Cigarettes   Smokeless tobacco: Never   Tobacco comments:    Using the patch now, trying to quit - 1 pack with last 2 days-09/13/2023  Vaping Use   Vaping status: Never Used  Substance and Sexual Activity   Alcohol use: Yes    Alcohol/week: 14.0 standard drinks of alcohol    Types: 14 Cans of beer per week    Comment: 2 beers/day - occasionally   Drug use: Not Currently   Sexual activity: Not Currently    Birth control/protection: Surgical  Other Topics Concern   Not on file  Social History Narrative   Not on file   Social Determinants of Health   Financial Resource Strain: Not on file  Food Insecurity: Food Insecurity Present (08/10/2023)  Hunger Vital Sign    Worried About Running Out of Food in the Last Year: Sometimes true    Ran Out of Food in the Last Year: Sometimes true  Transportation Needs: No Transportation Needs (08/10/2023)   PRAPARE - Administrator, Civil Service (Medical): No    Lack of Transportation (Non-Medical): No  Physical Activity: Not on file  Stress: Not on file  Social Connections: Not on file  Intimate Partner Violence: Not At Risk (08/10/2023)   Humiliation, Afraid, Rape, and Kick questionnaire    Fear of Current or Ex-Partner: No    Emotionally Abused: No    Physically Abused: No    Sexually Abused: No    Review of Systems: See HPI, otherwise negative ROS  Physical  Exam: There were no vitals taken for this visit. General:   Alert,  pleasant and cooperative in NAD Head:  Normocephalic and atraumatic. Neck:  Supple; no masses or thyromegaly. Lungs:  Clear throughout to auscultation, normal respiratory effort.    Heart:  +S1, +S2, Regular rate and rhythm, No edema. Abdomen:  Soft, nontender and nondistended. Normal bowel sounds, without guarding, and without rebound.   Neurologic:  Alert and  oriented x4;  grossly normal neurologically.  Impression/Plan: Cassidy Erickson is here for an colonoscopy to be performed for surveillance due to prior history of colon polyps   Risks, benefits, limitations, and alternatives regarding  colonoscopy have been reviewed with the patient.  Questions have been answered.  All parties agreeable.   Wyline Mood, MD  10/18/2023, 9:45 AM

## 2023-10-18 NOTE — Op Note (Signed)
[facility] Gastroenterology Patient Name: Cassidy Erickson Procedure Date: 10/18/2023 10:24 AM MRN: [id number] Account #: [id number] Date of Birth: 06-06-1961 Admit Type: Outpatient Age: 62 Room: [facility] ENDO ROOM 3 Gender: Female Note Status: Finalized Instrument Name: Peds Colonoscope [id number] Procedure:             Colonoscopy Indications:           Surveillance: Personal history of adenomatous polyps                         on last colonoscopy 5 years ago Providers:             Wyline Mood MD, MD Referring MD:          Wyline Mood MD, MD (Referring MD) Medicines:             Monitored Anesthesia Care Complications:         No immediate complications. Procedure:             Pre-Anesthesia Assessment:                        - Prior to the procedure, a History and Physical was                         performed, and patient medications, allergies and                         sensitivities were reviewed. The patient's tolerance                         of previous anesthesia was reviewed.                        - The risks and benefits of the procedure and the                         sedation options and risks were discussed with the                         patient. All questions were answered and informed                         consent was obtained.                        - ASA Grade Assessment: II - A patient with mild                         systemic disease.                        After obtaining informed consent, the colonoscope was                         passed under direct vision. Throughout the procedure,                         the patient's blood pressure, pulse, and oxygen  saturations were monitored continuously. The                         Colonoscope was introduced through the anus and                         advanced to the the cecum, identified by the                         appendiceal orifice. The colonoscopy was performed                          with ease. The patient tolerated the procedure well.                         The quality of the bowel preparation was excellent.                         The ileocecal valve, appendiceal orifice, and rectum                         were photographed. Findings:      The perianal and digital rectal examinations were normal.      Two sessile polyps were found in the sigmoid colon. The polyps were 4 to       5 mm in size. These polyps were removed with a cold snare. Resection and       retrieval were complete.      The exam was otherwise without abnormality on direct and retroflexion       views. Impression:            - Two 4 to 5 mm polyps in the sigmoid colon, removed                         with a cold snare. Resected and retrieved.                        - The examination was otherwise normal on direct and                         retroflexion views. Recommendation:        - Discharge patient to home (with escort).                        - Resume previous diet.                        - Continue present medications.                        - Await pathology results.                        - Repeat colonoscopy for surveillance based on                         pathology results. Procedure Code(s):     --- Professional ---  34742, Colonoscopy, flexible; with removal of                         tumor(s), polyp(s), or other lesion(s) by snare                         technique Diagnosis Code(s):     --- Professional ---                        D12.5, Benign neoplasm of sigmoid colon                        Z86.010, Personal history of colonic polyps CPT copyright 2022 American Medical Association. All rights reserved. The codes documented in this report are preliminary and upon coder review may  be revised to meet current compliance requirements. Wyline Mood, MD Wyline Mood MD, MD 10/18/2023 10:51:05 AM This report has been signed electronically. Number of  Addenda: 0 Note Initiated On: 10/18/2023 10:24 AM Scope Withdrawal Time: 0 hours 6 minutes 49 seconds  Total Procedure Duration: 0 hours 10 minutes 42 seconds  Estimated Blood Loss:  Estimated blood loss: none.      [facility]

## 2023-10-18 NOTE — Anesthesia Preprocedure Evaluation (Signed)
Anesthesia Evaluation  Patient identified by MRN, date of birth, ID band Patient awake    Reviewed: Allergy & Precautions, NPO status , Patient's Chart, lab work & pertinent test results  History of Anesthesia Complications Negative for: history of anesthetic complications  Airway Mallampati: II  TM Distance: >3 FB Neck ROM: Full    Dental  (+) Partial Upper   Pulmonary neg sleep apnea, neg COPD, Current Smoker and Patient abstained from smoking.   Pulmonary exam normal breath sounds clear to auscultation       Cardiovascular Exercise Tolerance: Good METS(-) hypertension(-) CAD and (-) Past MI negative cardio ROS (-) dysrhythmias  Rhythm:Regular Rate:Normal - Systolic murmurs    Neuro/Psych negative neurological ROS  negative psych ROS   GI/Hepatic ,GERD  Medicated,,(+)     (-) substance abuse    Endo/Other  neg diabetes    Renal/GU negative Renal ROS     Musculoskeletal   Abdominal   Peds  Hematology   Anesthesia Other Findings Past Medical History: No date: Arthritis No date: GERD (gastroesophageal reflux disease)  Reproductive/Obstetrics                             Anesthesia Physical Anesthesia Plan  ASA: 2  Anesthesia Plan: General   Post-op Pain Management: Minimal or no pain anticipated   Induction: Intravenous  PONV Risk Score and Plan: 2 and Propofol infusion, TIVA and Ondansetron  Airway Management Planned: Nasal Cannula  Additional Equipment: None  Intra-op Plan:   Post-operative Plan:   Informed Consent: I have reviewed the patients History and Physical, chart, labs and discussed the procedure including the risks, benefits and alternatives for the proposed anesthesia with the patient or authorized representative who has indicated his/her understanding and acceptance.     Dental advisory given  Plan Discussed with: CRNA and Surgeon  Anesthesia Plan  Comments: (Discussed risks of anesthesia with patient, including possibility of difficulty with spontaneous ventilation under anesthesia necessitating airway intervention, PONV, and rare risks such as cardiac or respiratory or neurological events, and allergic reactions. Discussed the role of CRNA in patient's perioperative care. Patient understands.)       Anesthesia Quick Evaluation

## 2023-10-18 NOTE — Anesthesia Postprocedure Evaluation (Signed)
Anesthesia Post Note  Patient: Cassidy Erickson  Procedure(s) Performed: COLONOSCOPY WITH PROPOFOL BIOPSY  Patient location during evaluation: Endoscopy Anesthesia Type: General Level of consciousness: awake and alert Pain management: pain level controlled Vital Signs Assessment: post-procedure vital signs reviewed and stable Respiratory status: spontaneous breathing, nonlabored ventilation, respiratory function stable and patient connected to nasal cannula oxygen Cardiovascular status: blood pressure returned to baseline and stable Postop Assessment: no apparent nausea or vomiting Anesthetic complications: no   No notable events documented.   Last Vitals:  Vitals:   10/18/23 1054 10/18/23 1104  BP: 130/76   Pulse:    Resp: 19 20  Temp: (!) 36.1 C   SpO2:      Last Pain:  Vitals:   10/18/23 1104  TempSrc:   PainSc: 0-No pain                 Corinda Gubler

## 2023-10-18 NOTE — Transfer of Care (Signed)
Immediate Anesthesia Transfer of Care Note  Patient: Cassidy Erickson  Procedure(s) Performed: COLONOSCOPY WITH PROPOFOL BIOPSY  Patient Location: PACU  Anesthesia Type:General  Level of Consciousness: drowsy  Airway & Oxygen Therapy: Patient Spontanous Breathing  Post-op Assessment: Report given to RN and Post -op Vital signs reviewed and stable  Post vital signs: Reviewed and stable  Last Vitals:  Vitals Value Taken Time  BP 130/76 10/18/23 1055  Temp 97.5   Pulse 89 10/18/23 1055  Resp 15   SpO2 98 % 10/18/23 1055  Vitals shown include unfiled device data.  Last Pain:  Vitals:   10/18/23 1017  TempSrc: Temporal  PainSc: 0-No pain         Complications: No notable events documented.

## 2023-10-19 ENCOUNTER — Encounter: Payer: Self-pay | Admitting: Gastroenterology

## 2023-10-19 LAB — SURGICAL PATHOLOGY

## 2023-10-25 ENCOUNTER — Encounter: Payer: Self-pay | Admitting: Gastroenterology

## 2023-12-05 ENCOUNTER — Ambulatory Visit: Payer: Medicare HMO

## 2023-12-05 ENCOUNTER — Ambulatory Visit
Admission: RE | Admit: 2023-12-05 | Discharge: 2023-12-05 | Disposition: A | Discharge status: Home / Self Care | Payer: Medicare HMO | Source: Ambulatory Visit | Attending: *Deleted | Admitting: *Deleted

## 2023-12-05 DIAGNOSIS — I700 Atherosclerosis of aorta: Secondary | ICD-10-CM | POA: Insufficient documentation

## 2023-12-05 DIAGNOSIS — J439 Emphysema, unspecified: Secondary | ICD-10-CM | POA: Diagnosis not present

## 2023-12-05 DIAGNOSIS — R911 Solitary pulmonary nodule: Secondary | ICD-10-CM | POA: Insufficient documentation

## 2023-12-12 ENCOUNTER — Encounter: Payer: Self-pay | Admitting: Pulmonary Disease

## 2023-12-12 ENCOUNTER — Ambulatory Visit (INDEPENDENT_AMBULATORY_CARE_PROVIDER_SITE_OTHER): Payer: Medicare HMO | Admitting: Pulmonary Disease

## 2023-12-12 VITALS — BP 120/78 | HR 83 | Temp 96.9°F | Ht 65.0 in | Wt 132.6 lb

## 2023-12-12 DIAGNOSIS — J4489 Other specified chronic obstructive pulmonary disease: Secondary | ICD-10-CM | POA: Diagnosis not present

## 2023-12-12 MED ORDER — ALBUTEROL SULFATE HFA 108 (90 BASE) MCG/ACT IN AERS: 2.0000 | INHALATION_SPRAY | Freq: Four times a day (QID) | RESPIRATORY_TRACT | 2 refills | Status: DC | PRN

## 2023-12-12 MED ORDER — FLUTICASONE FUROATE-VILANTEROL 200-25 MCG/ACT IN AEPB: 1.0000 | INHALATION_SPRAY | Freq: Every day | RESPIRATORY_TRACT | 6 refills | Status: DC

## 2023-12-12 NOTE — Progress Notes (Signed)
Synopsis: Referred in by [facility]   Subjective:   PATIENT ID: Cassidy Erickson GENDER: female DOB: January 01, 1961, MRN: [id number]  Chief Complaint  Patient presents with   Follow-up    No SOB, wheezing or cough.    HPI Cassidy Erickson is a pleasant 63 years old female patient with a past medical history of GERD, Asthma as a child, seasonal allergies presenting to the pulmonary clinic for a chronic cough.   She reports that she cut down on smoking significantly about 6 months ago and started noticing woresneing cough with sputum production. This was deemed secondary to backing on smoking. However this persisted and she presented to an urgent care clinic in september and was prescribed prednisone which made her feel significantly better. Chest X-ray done in septemeber with hyperinflation of the lungs slight flattening of the diaphragms.   We started her on Breo 200 last visit and she reports doing better. Has not required to use her rescue inhaler.   Repeat CT chest 01/21 without any significant nodules.   Of note, she is enrolled in the LDCT program at [facility], CT chest in 2022 with LLL perifissural nodule. CT chest in 2023 mentions a stable 0.7 cm RUL anterior nodule along with stable left lower lobe nodule.   Family history -  Both Father and brother with emphysema.   Social History - Active smoker, smokes 1PPD for 40 years. Drinks couple of beers every night. Denies illicit drug use. Lives home alone and has a dog.   ROS All systems were reviewed and are negative except for the above.  Objective:   Vitals:   12/12/23 0929  BP: 120/78  Pulse: 83  Temp: (!) 96.9 F (36.1 C)  SpO2: 96%  Weight: 132 lb 9.6 oz (60.1 kg)  Height: 5\' 5"  (1.651 m)   96% on RA BMI Readings from Last 3 Encounters:  12/12/23 22.07 kg/m  10/18/23 21.47 kg/m  09/13/23 21.57 kg/m   Wt Readings from Last 3 Encounters:  12/12/23 132 lb 9.6 oz (60.1 kg)  10/18/23 129 lb (58.5 kg)  09/13/23  129 lb 9.6 oz (58.8 kg)    Physical Exam GEN: NAD, Healthy Appearing HEENT: Supple Neck, Reactive Pupils, EOMI  CVS: Normal S1, Normal S2, RRR, No murmurs or ES appreciated  Lungs: Clear bilateral air entry.  Abdomen: Soft, non tender, non distended, + BS  Extremities: Warm and well perfused, No edema  Skin: No suspicious lesions appreciated  Psych: Normal Affect  Ancillary Information   CBC    Component Value Date/Time   WBC 6.4 08/10/2023 1145   WBC 5.5 08/07/2023 1105   RBC 4.76 08/10/2023 1145   HGB 15.4 (H) 08/10/2023 1145   HCT 45.5 08/10/2023 1145   PLT 264 08/10/2023 1145   MCV 95.6 08/10/2023 1145   MCH 32.4 08/10/2023 1145   MCHC 33.8 08/10/2023 1145   RDW 11.9 08/10/2023 1145   Labs and imaging were reviewed.      No data to display           Assessment & Plan:  Cassidy Erickson is a pleasant 63 years old female patient with a past medical history of GERD, Asthma as a child, seasonal allergies presenting to the pulmonary clinic for a chronic cough.   #Productive cough and DOE  Highly suggestive of COPD Asthma overalp.    []  PFTs  []  c/w Fluticasone-Vilanterol [Breo Ellipta] 200 1 puff daily. Advised mouth rinsing after each use.  []  C/w  Albuterol 2puffs Q6H as needed.   #RUL lung nodule measuring 0.7cm in August 2023.  Location is concerning and she is high risk. Repeat CT chest 01/21 with no concerning nidules.   []  C/w LDCT lung cancer screening.   #Tobacco Use Disorder  Advised complete abstinence. She tells me she is trying and cut back significantly. Has NRT at home. Will try to completely stop on Feb 1st.   No follow-ups on file.  I spent 30 minutes caring for this patient today, including preparing to see the patient, obtaining a medical history , reviewing a separately obtained history, performing a medically appropriate examination and/or evaluation, counseling and educating the patient/family/caregiver, ordering medications, tests, or  procedures, documenting clinical information in the electronic health record, and independently interpreting results (not separately reported/billed) and communicating results to the patient/family/caregiver  Janann Colonel, MD [facility] 12/12/2023 9:38 AM

## 2023-12-14 ENCOUNTER — Ambulatory Visit: Payer: Medicare HMO | Admitting: Pulmonary Disease

## 2023-12-22 ENCOUNTER — Encounter: Payer: Self-pay | Admitting: Podiatry

## 2023-12-22 ENCOUNTER — Ambulatory Visit: Payer: Medicare HMO | Admitting: Podiatry

## 2023-12-22 DIAGNOSIS — D2372 Other benign neoplasm of skin of left lower limb, including hip: Secondary | ICD-10-CM | POA: Diagnosis not present

## 2023-12-22 NOTE — Progress Notes (Signed)
   Chief Complaint  Patient presents with   Callouses    That callus has a strong pain in it.  He cut it off before.    Subjective: 63 y.o. female presenting to the office today for follow-up evaluation of a symptomatic skin lesion to the plantar aspect of the left forefoot.  Patient felt significant relief last visit about 3 months ago with Dr. Tobie but the lesion is slowly returned.  It is very symptomatic with walking and ambulating.   Past Medical History:  Diagnosis Date   Arthritis    GERD (gastroesophageal reflux disease)     Past Surgical History:  Procedure Laterality Date   BIOPSY  10/18/2023   Procedure: BIOPSY;  Surgeon: Therisa Bi, MD;  Location: [facility] ENDOSCOPY;  Service: Gastroenterology;;   COLONOSCOPY WITH PROPOFOL  N/A 07/03/2019   Procedure: COLONOSCOPY WITH PROPOFOL ;  Surgeon: Therisa Bi, MD;  Location: [facility] ENDOSCOPY;  Service: Gastroenterology;  Laterality: N/A;   COLONOSCOPY WITH PROPOFOL  N/A 10/18/2023   Procedure: COLONOSCOPY WITH PROPOFOL ;  Surgeon: Therisa Bi, MD;  Location: [facility] ENDOSCOPY;  Service: Gastroenterology;  Laterality: N/A;   FEMUR FRACTURE SURGERY Left 2018   TUBAL LIGATION  1986    Allergies  Allergen Reactions   Erythromycin Nausea And Vomiting     Objective:  Physical Exam General: Alert and oriented x3 in no acute distress  Dermatology: Hyperkeratotic lesion(s) present on the plantar aspect of the fifth MTP left. Pain on palpation with a central nucleated core noted. Skin is warm, dry and supple bilateral lower extremities. Negative for open lesions or macerations.  Vascular: Palpable pedal pulses bilaterally. No edema or erythema noted. Capillary refill within normal limits.  Neurological: Grossly intact via light touch  Musculoskeletal Exam: Pain on palpation at the keratotic lesion(s) noted. Range of motion within normal limits bilateral. Muscle strength 5/5 in all groups bilateral.  Assessment: 1.  Symptomatic benign  skin lesion plantar 5th MTP LEFT   Plan of Care:  -Patient evaluated -Excisional debridement of keratoic lesion(s) using a chisel blade was performed without incident.  -Salicylic acid applied with a bandaid -Recommend OTC salicylic acid daily as needed -Advised against going barefoot.  Recommend good supportive tennis shoes and sneakers -Return to the clinic PRN.   Thresa EMERSON Sar, DPM [facility]  Dr. Thresa EMERSON Sar, DPM    [address] [place], [place] [address]                Office [phone]  Fax [phone]

## 2024-02-29 ENCOUNTER — Other Ambulatory Visit: Payer: Self-pay

## 2024-02-29 DIAGNOSIS — J4489 Other specified chronic obstructive pulmonary disease: Secondary | ICD-10-CM

## 2024-04-15 ENCOUNTER — Ambulatory Visit: Admitting: Pulmonary Disease

## 2024-04-15 ENCOUNTER — Ambulatory Visit (INDEPENDENT_AMBULATORY_CARE_PROVIDER_SITE_OTHER): Admitting: Pulmonary Disease

## 2024-04-15 ENCOUNTER — Encounter: Payer: Self-pay | Admitting: Pulmonary Disease

## 2024-04-15 VITALS — BP 112/70 | HR 84 | Temp 96.9°F | Ht 65.0 in | Wt 132.0 lb

## 2024-04-15 DIAGNOSIS — J4489 Other specified chronic obstructive pulmonary disease: Secondary | ICD-10-CM | POA: Diagnosis not present

## 2024-04-15 DIAGNOSIS — J4530 Mild persistent asthma, uncomplicated: Secondary | ICD-10-CM | POA: Diagnosis not present

## 2024-04-15 DIAGNOSIS — R911 Solitary pulmonary nodule: Secondary | ICD-10-CM

## 2024-04-15 DIAGNOSIS — Z87891 Personal history of nicotine dependence: Secondary | ICD-10-CM

## 2024-04-15 DIAGNOSIS — J439 Emphysema, unspecified: Secondary | ICD-10-CM | POA: Diagnosis not present

## 2024-04-15 LAB — PULMONARY FUNCTION TEST
DL/VA % pred: 80 %
DL/VA: 3.34 ml/min/mmHg/L
DLCO unc % pred: 76 %
DLCO unc: 15.68 ml/min/mmHg
FEF 25-75 Post: 1.3 L/s
FEF 25-75 Pre: 1.02 L/s
FEF2575-%Change-Post: 27 %
FEF2575-%Pred-Post: 57 %
FEF2575-%Pred-Pre: 44 %
FEV1-%Change-Post: 8 %
FEV1-%Pred-Post: 84 %
FEV1-%Pred-Pre: 77 %
FEV1-Post: 2.16 L
FEV1-Pre: 1.98 L
FEV1FVC-%Change-Post: 9 %
FEV1FVC-%Pred-Pre: 82 %
FEV6-%Change-Post: 0 %
FEV6-%Pred-Post: 94 %
FEV6-%Pred-Pre: 94 %
FEV6-Post: 3.05 L
FEV6-Pre: 3.04 L
FEV6FVC-%Change-Post: 0 %
FEV6FVC-%Pred-Post: 102 %
FEV6FVC-%Pred-Pre: 101 %
FVC-%Change-Post: 0 %
FVC-%Pred-Post: 92 %
FVC-%Pred-Pre: 92 %
FVC-Post: 3.09 L
FVC-Pre: 3.1 L
Post FEV1/FVC ratio: 70 %
Post FEV6/FVC ratio: 99 %
Pre FEV1/FVC ratio: 64 %
Pre FEV6/FVC Ratio: 98 %
RV % pred: 112 %
RV: 2.35 L
TLC % pred: 102 %
TLC: 5.36 L

## 2024-04-15 NOTE — Progress Notes (Signed)
 Full PFT completed today ? ?

## 2024-04-15 NOTE — Patient Instructions (Signed)
 Full PFT completed today ? ?

## 2024-04-15 NOTE — Progress Notes (Signed)
 Synopsis: Referred in by Evette Hoes, NP   Subjective:   PATIENT ID: Cassidy Erickson GENDER: female DOB: 11-30-1960, MRN: [id number]  Chief Complaint  Patient presents with   Follow-up    No SOB or wheezing. Dry cough.    HPI Cassidy Erickson is a pleasant 63 years old female patient with a past medical history of GERD, Asthma as a child, seasonal allergies presenting to the pulmonary clinic for a chronic cough.   She reports that she cut down on smoking significantly about 6 months ago and started noticing woresneing cough with sputum production. This was deemed secondary to backing on smoking. However this persisted and she presented to an urgent care clinic in september and was prescribed prednisone  which made her feel significantly better. Chest X-ray done in septemeber with hyperinflation of the lungs slight flattening of the diaphragms.   We started her on Breo 200 last visit and she reports doing better. Has not required to use her rescue inhaler.   Repeat CT chest 01/21 without any significant nodules.   Of note, she is enrolled in the LDCT program at [facility], CT chest in 2022 with LLL perifissural nodule. CT chest in 2023 mentions a stable 0.7 cm RUL anterior nodule along with stable left lower lobe nodule.   Family history -  Both Father and brother with emphysema.   Social History - Active smoker, smokes 1PPD for 40 years. Drinks couple of beers every night. Denies illicit drug use. Lives home alone and has a dog.   OV 04-15-2024 - Cassidy Erickson is here to follow up on PFTs results done today. Showing obstruction defect with significant response to bronchodilators to normalization. DLCO is at LLN. Discussed that findings are predominantly associated with asthma though some component of COPD might be present. She is happy with Breo and symptoms of cough sob chest tightness have resolved. She is compliant with her treatment.   ROS All systems were reviewed and are negative except  for the above.  Objective:   Vitals:   04/15/24 1351  BP: 112/70  Pulse: 84  Temp: (!) 96.9 F (36.1 C)  SpO2: 97%  Weight: 132 lb (59.9 kg)  Height: 5\' 5"  (1.651 m)   97% on RA BMI Readings from Last 3 Encounters:  04/15/24 21.97 kg/m  04/15/24 21.97 kg/m  12/12/23 22.07 kg/m   Wt Readings from Last 3 Encounters:  04/15/24 132 lb (59.9 kg)  04/15/24 132 lb (59.9 kg)  12/12/23 132 lb 9.6 oz (60.1 kg)    Physical Exam GEN: NAD, Healthy Appearing HEENT: Supple Neck, Reactive Pupils, EOMI  CVS: Normal S1, Normal S2, RRR, No murmurs or ES appreciated  Lungs: Clear bilateral air entry.  Abdomen: Soft, non tender, non distended, + BS  Extremities: Warm and well perfused, No edema  Skin: No suspicious lesions appreciated  Psych: Normal Affect  Ancillary Information   CBC    Component Value Date/Time   WBC 6.4 08/10/2023 1145   WBC 5.5 08/07/2023 1105   RBC 4.76 08/10/2023 1145   HGB 15.4 (H) 08/10/2023 1145   HCT 45.5 08/10/2023 1145   PLT 264 08/10/2023 1145   MCV 95.6 08/10/2023 1145   MCH 32.4 08/10/2023 1145   MCHC 33.8 08/10/2023 1145   RDW 11.9 08/10/2023 1145   Labs and imaging were reviewed.     Latest Ref Rng & Units 04/15/2024   12:36 PM  PFT Results  FVC-Pre L 3.10  P  FVC-Predicted Pre % 92  P  FVC-Post L 3.09  P  FVC-Predicted Post % 92  P  Pre FEV1/FVC % % 64  P  Post FEV1/FCV % % 70  P  FEV1-Pre L 1.98  P  FEV1-Predicted Pre % 77  P  FEV1-Post L 2.16  P  DLCO uncorrected ml/min/mmHg 15.68  P  DLCO [facility]C% % 76  P  DLVA Predicted % 80  P  TLC L 5.36  P  TLC % Predicted % 102  P  RV % Predicted % 112  P    P Preliminary result     Assessment & Plan:  Cassidy Erickson is a pleasant 63 years old female patient with a past medical history of GERD, Asthma as a child, seasonal allergies presenting to the pulmonary clinic for a chronic cough.   #Moderate persistent asthma #Some component of COPD/ overlap   []  c/w Fluticasone -Vilanterol [Breo  Ellipta] 200 1 puff daily. Advised mouth rinsing after each use.  []  C/w Albuterol  2puffs Q6H as needed.   #RUL lung nodule measuring 0.7cm in August 2023.  Location is concerning and she is high risk. Repeat CT chest 01/21 with no concerning nidules.   []  C/w LDCT lung cancer screening.   #Tobacco Use Disorder  Advised complete abstinence. She tells me she is trying and cut back significantly. Has NRT at home.   RTC 6 monhts.   I spent 30 minutes caring for this patient today, including preparing to see the patient, obtaining a medical history , reviewing a separately obtained history, performing a medically appropriate examination and/or evaluation, counseling and educating the patient/family/caregiver, ordering medications, tests, or procedures, documenting clinical information in the electronic health record, and independently interpreting results (not separately reported/billed) and communicating results to the patient/family/caregiver  Annitta Kindler, MD [facility] 04/15/2024 1:55 PM

## 2024-05-07 ENCOUNTER — Other Ambulatory Visit: Payer: Self-pay | Admitting: *Deleted

## 2024-05-07 DIAGNOSIS — D751 Secondary polycythemia: Secondary | ICD-10-CM

## 2024-05-08 ENCOUNTER — Inpatient Hospital Stay (HOSPITAL_BASED_OUTPATIENT_CLINIC_OR_DEPARTMENT_OTHER): Admitting: Oncology

## 2024-05-08 ENCOUNTER — Inpatient Hospital Stay: Attending: Oncology

## 2024-05-08 ENCOUNTER — Inpatient Hospital Stay

## 2024-05-08 ENCOUNTER — Encounter: Payer: Self-pay | Admitting: Oncology

## 2024-05-08 DIAGNOSIS — D45 Polycythemia vera: Secondary | ICD-10-CM | POA: Diagnosis present

## 2024-05-08 DIAGNOSIS — D751 Secondary polycythemia: Secondary | ICD-10-CM

## 2024-05-08 LAB — CBC WITH DIFFERENTIAL/PLATELET
Abs Immature Granulocytes: 0.01 10*3/uL (ref 0.00–0.07)
Basophils Absolute: 0 10*3/uL (ref 0.0–0.1)
Basophils Relative: 1 %
Eosinophils Absolute: 0.2 10*3/uL (ref 0.0–0.5)
Eosinophils Relative: 3 %
HCT: 44.5 % (ref 36.0–46.0)
Hemoglobin: 15.3 g/dL — ABNORMAL HIGH (ref 12.0–15.0)
Immature Granulocytes: 0 %
Lymphocytes Relative: 31 %
Lymphs Abs: 1.4 10*3/uL (ref 0.7–4.0)
MCH: 31.4 pg (ref 26.0–34.0)
MCHC: 34.4 g/dL (ref 30.0–36.0)
MCV: 91.4 fL (ref 80.0–100.0)
Monocytes Absolute: 0.5 10*3/uL (ref 0.1–1.0)
Monocytes Relative: 11 %
Neutro Abs: 2.4 10*3/uL (ref 1.7–7.7)
Neutrophils Relative %: 54 %
Platelets: 258 10*3/uL (ref 150–400)
RBC: 4.87 MIL/uL (ref 3.87–5.11)
RDW: 13.2 % (ref 11.5–15.5)
WBC: 4.5 10*3/uL (ref 4.0–10.5)
nRBC: 0 % (ref 0.0–0.2)

## 2024-05-08 NOTE — Progress Notes (Signed)
 [facility]  [phone] [phone]  ID: Cassidy Erickson OB: [birth year]  MR#: [id number]  RDW#:[id number]  Patient Care Team: Osa Geralds, NP as PCP - General (Nurse Practitioner) Cindie Jesusa HERO, RN as Registered Nurse Dannielle Arlean FALCON, RN (Inactive) as Registered Nurse  CHIEF COMPLAINT: Iron overload.  INTERVAL HISTORY: Patient referred back to clinic for further evaluation and consideration of phlebotomy after her primary care provider noted a significant increase in patient's iron stores.  She currently feels well and is asymptomatic.  She has no neurologic complaints.  She denies any recent fevers or illnesses.  She has a good appetite and denies weight loss.  She has no chest pain, shortness of breath, cough, or hemoptysis.  She denies any nausea, vomiting, constipation, or diarrhea.  She has no urinary complaints.  Patient offers no further specific complaints today.    REVIEW OF SYSTEMS:   Review of Systems  Constitutional: Negative.  Negative for fever, malaise/fatigue and weight loss.  Respiratory: Negative.  Negative for cough, hemoptysis and shortness of breath.   Cardiovascular: Negative.  Negative for chest pain and leg swelling.  Gastrointestinal: Negative.  Negative for abdominal pain.  Genitourinary: Negative.  Negative for dysuria.  Musculoskeletal: Negative.  Negative for back pain.  Skin: Negative.  Negative for rash.  Neurological: Negative.  Negative for dizziness, focal weakness, weakness and headaches.  Psychiatric/Behavioral: Negative.  The patient is not nervous/anxious.     As per HPI. Otherwise, a complete review of systems is negative.  PAST MEDICAL HISTORY: Past Medical History:  Diagnosis Date   Arthritis    GERD (gastroesophageal reflux disease)     PAST SURGICAL HISTORY: Past Surgical History:  Procedure Laterality Date   BIOPSY  10/18/2023   Procedure: BIOPSY;  Surgeon: Therisa Bi, MD;  Location:  [facility] ENDOSCOPY;  Service: Gastroenterology;;   COLONOSCOPY WITH PROPOFOL  N/A 07/03/2019   Procedure: COLONOSCOPY WITH PROPOFOL ;  Surgeon: Therisa Bi, MD;  Location: [facility] ENDOSCOPY;  Service: Gastroenterology;  Laterality: N/A;   COLONOSCOPY WITH PROPOFOL  N/A 10/18/2023   Procedure: COLONOSCOPY WITH PROPOFOL ;  Surgeon: Therisa Bi, MD;  Location: [facility] ENDOSCOPY;  Service: Gastroenterology;  Laterality: N/A;   FEMUR FRACTURE SURGERY Left 2018   TUBAL LIGATION  1986    FAMILY HISTORY: Family History  Problem Relation Age of Onset   Arthritis Mother    COPD Father    Heart disease Father    Arthritis Sister    COPD Sister     ADVANCED DIRECTIVES (Y/N):  N  HEALTH MAINTENANCE: Social History   Tobacco Use   Smoking status: Every Day    Current packs/day: 0.50    Types: Cigarettes   Smokeless tobacco: Never   Tobacco comments:    10 cigarettes a day. Khj 04/15/2024  Vaping Use   Vaping status: Never Used  Substance Use Topics   Alcohol use: Yes    Alcohol/week: 14.0 standard drinks of alcohol    Types: 14 Cans of beer per week    Comment: 2 beers/day - occasionally   Drug use: Not Currently     Colonoscopy:  PAP:  Bone density:  Lipid panel:  Allergies  Allergen Reactions   Erythromycin Nausea And Vomiting    Current Outpatient Medications  Medication Sig Dispense Refill   albuterol  (VENTOLIN  HFA) 108 (90 Base) MCG/ACT inhaler Inhale 2 puffs into the lungs every 6 (six) hours as needed for wheezing or shortness of breath. 8 g 2   buPROPion (WELLBUTRIN SR) 150  MG 12 hr tablet      cetirizine (ZYRTEC) 10 MG tablet Take 10 mg by mouth daily.     cyclobenzaprine (FLEXERIL) 10 MG tablet Take 10 mg by mouth 2 (two) times daily as needed.     fluticasone  furoate-vilanterol (BREO ELLIPTA ) 200-25 MCG/ACT AEPB Inhale 1 puff into the lungs daily. 3 each 3   fluticasone  furoate-vilanterol (BREO ELLIPTA ) 200-25 MCG/ACT AEPB Inhale 1 puff into the lungs daily. 90 each 6    ibuprofen (ADVIL) 600 MG tablet      naproxen (NAPROSYN) 500 MG tablet      naproxen sodium (ALEVE) 220 MG tablet Take by mouth.     omeprazole (PRILOSEC) 20 MG capsule Take 20 mg by mouth daily.     albuterol  (VENTOLIN  HFA) 108 (90 Base) MCG/ACT inhaler Inhale 2 puffs into the lungs every 6 (six) hours as needed for wheezing or shortness of breath. (Patient not taking: Reported on 05/08/2024) 8.5 g 2   cholecalciferol (VITAMIN D3) 25 MCG (1000 UNIT) tablet Take 1,000 Units by mouth daily. (Patient not taking: Reported on 05/08/2024)     No current facility-administered medications for this visit.    OBJECTIVE: Vitals:   05/08/24 1318  BP: 127/79  Pulse: 89  Resp: 16  Temp: 97.6 F (36.4 C)  SpO2: 99%      Body mass index is 21.45 kg/m.    ECOG FS:0 - Asymptomatic  General: Well-developed, well-nourished, no acute distress. Eyes: Pink conjunctiva, anicteric sclera. HEENT: Normocephalic, moist mucous membranes. Lungs: No audible wheezing or coughing. Heart: Regular rate and rhythm. Abdomen: Soft, nontender, no obvious distention. Musculoskeletal: No edema, cyanosis, or clubbing. Neuro: Alert, answering all questions appropriately. Cranial nerves grossly intact. Skin: No rashes or petechiae noted. Psych: Normal affect.  LAB RESULTS:  Lab Results  Component Value Date   NA 137 08/07/2023   K 3.5 08/07/2023   CL 103 08/07/2023   CO2 23 08/07/2023   GLUCOSE 127 (H) 08/07/2023   BUN 11 08/07/2023   CREATININE 0.59 08/07/2023   CALCIUM 9.4 08/07/2023   PROT 7.3 06/12/2023   ALBUMIN 4.7 06/12/2023   AST 34 06/12/2023   ALKPHOS 116 06/12/2023   BILITOT 0.3 06/12/2023   GFRNONAA >60 08/07/2023    Lab Results  Component Value Date   WBC 4.5 05/08/2024   NEUTROABS 2.4 05/08/2024   HGB 15.3 (H) 05/08/2024   HCT 44.5 05/08/2024   MCV 91.4 05/08/2024   PLT 258 05/08/2024     STUDIES: No results found.  ASSESSMENT: Iron overload.  PLAN:    Iron overload: Patient  is iron percent saturation to increase to greater than 60% at outside lab.  Previously, all of her laboratory work including JAK2 mutation with reflex was negative or within normal limits.  Will order hemochromatosis labs with next visit.  Will proceed with 500 mL phlebotomy today.  Return to clinic in 4 weeks for laboratory work only and then in 5 weeks for further evaluation and consideration of phlebotomy.    I spent a total of 30 minutes reviewing chart data, face-to-face evaluation with the patient, counseling and coordination of care as detailed above.   Patient expressed understanding and was in agreement with this plan. She also understands that She can call clinic at any time with any questions, concerns, or complaints.    Evalene JINNY Reusing, MD   05/08/2024 1:25 PM

## 2024-05-08 NOTE — Patient Instructions (Signed)

## 2024-05-08 NOTE — Progress Notes (Signed)
  Cassidy Erickson presents today for phlebotomy per MD orders. Phlebotomy procedure started at 1424 and ended at 1447. 500 mls removed. Patient tolerated procedure well. IV needle removed intact.

## 2024-06-05 ENCOUNTER — Inpatient Hospital Stay: Attending: Oncology

## 2024-06-05 DIAGNOSIS — F17210 Nicotine dependence, cigarettes, uncomplicated: Secondary | ICD-10-CM | POA: Insufficient documentation

## 2024-06-05 DIAGNOSIS — D45 Polycythemia vera: Secondary | ICD-10-CM | POA: Insufficient documentation

## 2024-06-05 LAB — CBC (CANCER CENTER ONLY)
HCT: 38.7 % (ref 36.0–46.0)
Hemoglobin: 13 g/dL (ref 12.0–15.0)
MCH: 31.8 pg (ref 26.0–34.0)
MCHC: 33.6 g/dL (ref 30.0–36.0)
MCV: 94.6 fL (ref 80.0–100.0)
Platelet Count: 265 K/uL (ref 150–400)
RBC: 4.09 MIL/uL (ref 3.87–5.11)
RDW: 13.9 % (ref 11.5–15.5)
WBC Count: 4.8 K/uL (ref 4.0–10.5)
nRBC: 0 % (ref 0.0–0.2)

## 2024-06-05 LAB — FERRITIN: Ferritin: 80 ng/mL (ref 11–307)

## 2024-06-05 LAB — IRON AND TIBC
Iron: 120 ug/dL (ref 28–170)
Saturation Ratios: 34 % — ABNORMAL HIGH (ref 10.4–31.8)
TIBC: 357 ug/dL (ref 250–450)
UIBC: 237 ug/dL

## 2024-06-10 LAB — HEMOCHROMATOSIS DNA-PCR(C282Y,H63D)

## 2024-06-12 ENCOUNTER — Encounter: Payer: Self-pay | Admitting: Oncology

## 2024-06-12 ENCOUNTER — Inpatient Hospital Stay (HOSPITAL_BASED_OUTPATIENT_CLINIC_OR_DEPARTMENT_OTHER): Admitting: Oncology

## 2024-06-12 ENCOUNTER — Inpatient Hospital Stay

## 2024-06-12 DIAGNOSIS — D45 Polycythemia vera: Secondary | ICD-10-CM | POA: Diagnosis not present

## 2024-06-12 NOTE — Progress Notes (Unsigned)
 [facility]  Telephone:([phone] [phone]  ID: Cassidy Erickson OB: 1961-09-13  MR#: [id number]  RDW#:[id number]  Patient Care Team: Cassidy Geralds, NP as PCP - General (Nurse Practitioner) Cassidy Jesusa HERO, RN as Registered Nurse Cassidy Arlean FALCON, RN (Inactive) as Registered Nurse Cassidy Cassidy PARAS, MD as Consulting Physician (Oncology)  CHIEF COMPLAINT: Iron overload.  INTERVAL HISTORY: Patient returns to clinic today for repeat laboratory work, further evaluation, and consideration of additional phlebotomy.  She continues to feel well and remains asymptomatic.  She has no neurologic complaints.  She denies any recent fevers or illnesses.  She has a good appetite and denies weight loss.  She has no chest pain, shortness of breath, cough, or hemoptysis.  She denies any nausea, vomiting, constipation, or diarrhea.  She has no urinary complaints.  Patient offers no specific complaints today.  REVIEW OF SYSTEMS:   Review of Systems  Constitutional: Negative.  Negative for fever, malaise/fatigue and weight loss.  Respiratory: Negative.  Negative for cough, hemoptysis and shortness of breath.   Cardiovascular: Negative.  Negative for chest pain and leg swelling.  Gastrointestinal: Negative.  Negative for abdominal pain.  Genitourinary: Negative.  Negative for dysuria.  Musculoskeletal: Negative.  Negative for back pain.  Skin: Negative.  Negative for rash.  Neurological: Negative.  Negative for dizziness, focal weakness, weakness and headaches.  Psychiatric/Behavioral: Negative.  The patient is not nervous/anxious.     As per HPI. Otherwise, a complete review of systems is negative.  PAST MEDICAL HISTORY: Past Medical History:  Diagnosis Date   Arthritis    GERD (gastroesophageal reflux disease)     PAST SURGICAL HISTORY: Past Surgical History:  Procedure Laterality Date   BIOPSY  10/18/2023   Procedure: BIOPSY;  Surgeon: Therisa Bi, MD;   Location: [facility] ENDOSCOPY;  Service: Gastroenterology;;   COLONOSCOPY WITH PROPOFOL  N/A 07/03/2019   Procedure: COLONOSCOPY WITH PROPOFOL ;  Surgeon: Therisa Bi, MD;  Location: [facility] ENDOSCOPY;  Service: Gastroenterology;  Laterality: N/A;   COLONOSCOPY WITH PROPOFOL  N/A 10/18/2023   Procedure: COLONOSCOPY WITH PROPOFOL ;  Surgeon: Therisa Bi, MD;  Location: [facility] ENDOSCOPY;  Service: Gastroenterology;  Laterality: N/A;   FEMUR FRACTURE SURGERY Left 2018   TUBAL LIGATION  1986    FAMILY HISTORY: Family History  Problem Relation Age of Onset   Arthritis Mother    COPD Father    Heart disease Father    Arthritis Sister    COPD Sister     ADVANCED DIRECTIVES (Y/N):  N  HEALTH MAINTENANCE: Social History   Tobacco Use   Smoking status: Every Day    Current packs/day: 0.50    Types: Cigarettes   Smokeless tobacco: Never   Tobacco comments:    10 cigarettes a day. Khj 04/15/2024  Vaping Use   Vaping status: Never Used  Substance Use Topics   Alcohol use: Yes    Alcohol/week: 14.0 standard drinks of alcohol    Types: 14 Cans of beer per week    Comment: 2 beers/day - occasionally   Drug use: Not Currently     Colonoscopy:  PAP:  Bone density:  Lipid panel:  Allergies  Allergen Reactions   Erythromycin Nausea And Vomiting    Current Outpatient Medications  Medication Sig Dispense Refill   albuterol  (VENTOLIN  HFA) 108 (90 Base) MCG/ACT inhaler Inhale 2 puffs into the lungs every 6 (six) hours as needed for wheezing or shortness of breath. 8 g 2   buPROPion (WELLBUTRIN SR) 150 MG 12 hr  tablet      cetirizine (ZYRTEC) 10 MG tablet Take 10 mg by mouth daily.     cyclobenzaprine (FLEXERIL) 10 MG tablet Take 10 mg by mouth 2 (two) times daily as needed.     fluticasone  furoate-vilanterol (BREO ELLIPTA ) 200-25 MCG/ACT AEPB Inhale 1 puff into the lungs daily. 3 each 3   fluticasone  furoate-vilanterol (BREO ELLIPTA ) 200-25 MCG/ACT AEPB Inhale 1 puff into the lungs daily. 90 each 6    ibuprofen (ADVIL) 600 MG tablet      naproxen (NAPROSYN) 500 MG tablet      naproxen sodium (ALEVE) 220 MG tablet Take by mouth.     omeprazole (PRILOSEC) 20 MG capsule Take 20 mg by mouth daily.     albuterol  (VENTOLIN  HFA) 108 (90 Base) MCG/ACT inhaler Inhale 2 puffs into the lungs every 6 (six) hours as needed for wheezing or shortness of breath. (Patient not taking: Reported on 06/12/2024) 8.5 g 2   cholecalciferol (VITAMIN D3) 25 MCG (1000 UNIT) tablet Take 1,000 Units by mouth daily. (Patient not taking: Reported on 06/12/2024)     No current facility-administered medications for this visit.    OBJECTIVE: Vitals:   06/12/24 1401  BP: 112/64  Pulse: 92  Resp: 18  Temp: 97.7 F (36.5 C)  SpO2: 100%      Body mass index is 21.3 kg/m.    ECOG FS:0 - Asymptomatic  General: Well-developed, well-nourished, no acute distress. Eyes: Pink conjunctiva, anicteric sclera. HEENT: Normocephalic, moist mucous membranes. Lungs: No audible wheezing or coughing. Heart: Regular rate and rhythm. Abdomen: Soft, nontender, no obvious distention. Musculoskeletal: No edema, cyanosis, or clubbing. Neuro: Alert, answering all questions appropriately. Cranial nerves grossly intact. Skin: No rashes or petechiae noted. Psych: Normal affect.  LAB RESULTS:  Lab Results  Component Value Date   NA 137 08/07/2023   K 3.5 08/07/2023   CL 103 08/07/2023   CO2 23 08/07/2023   GLUCOSE 127 (H) 08/07/2023   BUN 11 08/07/2023   CREATININE 0.59 08/07/2023   CALCIUM 9.4 08/07/2023   PROT 7.3 06/12/2023   ALBUMIN 4.7 06/12/2023   AST 34 06/12/2023   ALKPHOS 116 06/12/2023   BILITOT 0.3 06/12/2023   GFRNONAA >60 08/07/2023    Lab Results  Component Value Date   WBC 4.8 06/05/2024   NEUTROABS 2.4 05/08/2024   HGB 13.0 06/05/2024   HCT 38.7 06/05/2024   MCV 94.6 06/05/2024   PLT 265 06/05/2024   Lab Results  Component Value Date   IRON 120 06/05/2024   TIBC 357 06/05/2024   IRONPCTSAT 34  (H) 06/05/2024   Lab Results  Component Value Date   FERRITIN 80 06/05/2024     STUDIES: No results found.  ASSESSMENT: Iron overload.  PLAN:    Iron overload: Patient's hemoglobin and iron stores remain essentially within normal limits.  Her elevated iron saturation level is decreased from 60% down to 34% with recent phlebotomy.  Previously, all of her laboratory work including JAK2 mutation with reflex and hemochromatosis mutation was either negative or within normal limits.  No phlebotomy is needed at this time.  Return to clinic in 3 months with repeat laboratory work and further evaluation.  If patient's iron panel continues to remain essentially within normal limits, can consider discharging patient from clinic.    I spent a total of 20 minutes reviewing chart data, face-to-face evaluation with the patient, counseling and coordination of care as detailed above.   Patient expressed understanding and was in agreement  with this plan. She also understands that She can call clinic at any time with any questions, concerns, or complaints.    Cassidy JINNY Reusing, MD   06/12/2024 2:37 PM

## 2024-06-12 NOTE — Progress Notes (Signed)
No phlebotomy today.

## 2024-06-12 NOTE — Progress Notes (Unsigned)
 Patient is doing good, no new questions for the doctor today

## 2024-06-14 ENCOUNTER — Encounter: Payer: Self-pay | Admitting: Oncology

## 2024-08-27 ENCOUNTER — Ambulatory Visit: Admitting: Podiatry

## 2024-08-27 ENCOUNTER — Encounter: Payer: Self-pay | Admitting: Podiatry

## 2024-08-27 VITALS — Ht 65.0 in | Wt 128.0 lb

## 2024-08-27 DIAGNOSIS — D2372 Other benign neoplasm of skin of left lower limb, including hip: Secondary | ICD-10-CM | POA: Diagnosis not present

## 2024-08-27 DIAGNOSIS — M216X2 Other acquired deformities of left foot: Secondary | ICD-10-CM

## 2024-08-27 DIAGNOSIS — Z01818 Encounter for other preprocedural examination: Secondary | ICD-10-CM

## 2024-08-27 NOTE — Progress Notes (Signed)
   Chief Complaint  Patient presents with   Callouses    Pt is here due to callous on the bottom of the right foot, she would like for it to be shave off.    Subjective: 63 y.o. female presenting to the office today for follow-up evaluation of a symptomatic skin lesion to the plantar aspect of the left forefoot.  The skin lesion continues to recur and become very symptomatic and painful.   Past Medical History:  Diagnosis Date   Arthritis    GERD (gastroesophageal reflux disease)     Past Surgical History:  Procedure Laterality Date   BIOPSY  10/18/2023   Procedure: BIOPSY;  Surgeon: Therisa Bi, MD;  Location: [facility] ENDOSCOPY;  Service: Gastroenterology;;   COLONOSCOPY WITH PROPOFOL  N/A 07/03/2019   Procedure: COLONOSCOPY WITH PROPOFOL ;  Surgeon: Therisa Bi, MD;  Location: [facility] ENDOSCOPY;  Service: Gastroenterology;  Laterality: N/A;   COLONOSCOPY WITH PROPOFOL  N/A 10/18/2023   Procedure: COLONOSCOPY WITH PROPOFOL ;  Surgeon: Therisa Bi, MD;  Location: [facility] ENDOSCOPY;  Service: Gastroenterology;  Laterality: N/A;   FEMUR FRACTURE SURGERY Left 2018   TUBAL LIGATION  1986    Allergies  Allergen Reactions   Erythromycin Nausea And Vomiting     Objective:  Physical Exam General: Alert and oriented x3 in no acute distress  Dermatology: Hyperkeratotic lesion(s) present on the plantar aspect of the fifth MTP left. Pain on palpation with a central nucleated core noted. Skin is warm, dry and supple bilateral lower extremities. Negative for open lesions or macerations.  Vascular: Palpable pedal pulses bilaterally. No edema or erythema noted. Capillary refill within normal limits.  Neurological: Grossly intact via light touch  Musculoskeletal Exam: Pain on palpation at the keratotic lesion(s) noted. Range of motion within normal limits bilateral. Muscle strength 5/5 in all groups bilateral.  Plantarflexed metatarsal with prominence of the plantar fifth MTP noted contributing to the  symptomatic lesion  Assessment: 1.  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  Plantarflexed metatarsal fifth metatarsal left  Plan of Care:  -Patient evaluated -Excisional debridement of keratoic lesion(s) using a chisel blade was performed without incident.  -Salicylic acid applied with a bandaid - Unfortunately the patient continues to have pain and tenderness associated to recurrence of the symptomatic skin lesion and plantarflexed fifth metatarsal head.  This has been ongoing for several years despite conservative treatment.  I do believe it is appropriate to discuss surgery at this time.  Surgery would consist of a floating fifth metatarsal osteotomy to the left foot.  Risk benefits advantages and disadvantages the procedure as well as postoperative recovery was also explained at length in detail.  No guarantees were expressed or implied.  All patient questions answered.  Patient opts for surgical intervention at this time -Authorization for surgery was initiated today.  Surgery will consist of fifth metatarsal floating osteotomy left -Return to clinic 1 week postop  Thresa EMERSON Sar, DPM [facility]  Dr. Thresa EMERSON Sar, DPM    [address] [place], [place] [address]                Office [phone]  Fax [phone]

## 2024-09-03 ENCOUNTER — Telehealth: Payer: Self-pay | Admitting: Podiatry

## 2024-09-03 NOTE — Telephone Encounter (Signed)
 Called and left message for patient to contact me back to schedule surgery. Sent MyChart message.

## 2024-09-06 NOTE — Telephone Encounter (Signed)
 Patient called back and is scheduled for surgery on 09/26/2024. Patient is not on any GLP1 medications or blood thinners. Patient confirmed receipt of surgery bag and is aware that [facility] will call 24-48 hours prior to surgery with arrival time. Patient pharmacy has been confirmed in chart.

## 2024-09-10 ENCOUNTER — Telehealth: Payer: Self-pay | Admitting: Podiatry

## 2024-09-10 NOTE — Telephone Encounter (Signed)
 DOS- 09/26/2024  5TH METATARSAL OSTEOTOMY LT- 71691  [employer] EFFECTIVE DATE- 08/15/2023  DEDUCTIBLE- $0 REMAINING- $0 OOP- $5500 REMAINING- $5066.34 COINSURANCE- 0%  PER AVAILITY PORTAL, NO PRIOR AUTH IS REQUIRED FOR CPT CODE 71691. DOCUMENTATION ATTACHED TO SURGERY CONSENT PACKET.

## 2024-09-11 ENCOUNTER — Other Ambulatory Visit: Payer: Self-pay

## 2024-09-11 DIAGNOSIS — D751 Secondary polycythemia: Secondary | ICD-10-CM

## 2024-09-12 ENCOUNTER — Inpatient Hospital Stay: Attending: Oncology

## 2024-09-12 DIAGNOSIS — D45 Polycythemia vera: Secondary | ICD-10-CM | POA: Diagnosis present

## 2024-09-12 DIAGNOSIS — D751 Secondary polycythemia: Secondary | ICD-10-CM

## 2024-09-12 DIAGNOSIS — F17210 Nicotine dependence, cigarettes, uncomplicated: Secondary | ICD-10-CM | POA: Insufficient documentation

## 2024-09-12 LAB — CBC WITH DIFFERENTIAL (CANCER CENTER ONLY)
Abs Immature Granulocytes: 0.01 K/uL (ref 0.00–0.07)
Basophils Absolute: 0 K/uL (ref 0.0–0.1)
Basophils Relative: 1 %
Eosinophils Absolute: 0.1 K/uL (ref 0.0–0.5)
Eosinophils Relative: 3 %
HCT: 44.8 % (ref 36.0–46.0)
Hemoglobin: 15.3 g/dL — ABNORMAL HIGH (ref 12.0–15.0)
Immature Granulocytes: 0 %
Lymphocytes Relative: 34 %
Lymphs Abs: 1.3 K/uL (ref 0.7–4.0)
MCH: 31.7 pg (ref 26.0–34.0)
MCHC: 34.2 g/dL (ref 30.0–36.0)
MCV: 92.8 fL (ref 80.0–100.0)
Monocytes Absolute: 0.7 K/uL (ref 0.1–1.0)
Monocytes Relative: 18 %
Neutro Abs: 1.7 K/uL (ref 1.7–7.7)
Neutrophils Relative %: 44 %
Platelet Count: 283 K/uL (ref 150–400)
RBC: 4.83 MIL/uL (ref 3.87–5.11)
RDW: 12.9 % (ref 11.5–15.5)
WBC Count: 3.8 K/uL — ABNORMAL LOW (ref 4.0–10.5)
nRBC: 0 % (ref 0.0–0.2)

## 2024-09-12 LAB — IRON AND TIBC
Iron: 146 ug/dL (ref 28–170)
Saturation Ratios: 34 % — ABNORMAL HIGH (ref 10.4–31.8)
TIBC: 435 ug/dL (ref 250–450)
UIBC: 289 ug/dL

## 2024-09-12 LAB — FERRITIN: Ferritin: 46 ng/mL (ref 11–307)

## 2024-09-13 ENCOUNTER — Encounter: Payer: Self-pay | Admitting: Oncology

## 2024-09-13 ENCOUNTER — Inpatient Hospital Stay

## 2024-09-13 ENCOUNTER — Inpatient Hospital Stay (HOSPITAL_BASED_OUTPATIENT_CLINIC_OR_DEPARTMENT_OTHER): Admitting: Oncology

## 2024-09-13 DIAGNOSIS — D45 Polycythemia vera: Secondary | ICD-10-CM | POA: Diagnosis not present

## 2024-09-13 NOTE — Progress Notes (Signed)
 Patient is doing ok, no new questions for the doctor today.

## 2024-09-13 NOTE — Progress Notes (Signed)
 [facility]  Telephone:(336) [phone] Fax:(336) [phone]  ID: Cassidy Erickson OB: 11/22/1960  MR#: [id number]  RDW#:[id number]  Patient Care Team: Osa Geralds, NP as PCP - General (Nurse Practitioner) Cindie Jesusa HERO, RN as Registered Nurse Dannielle Arlean FALCON, RN (Inactive) as Registered Nurse Jacobo Evalene PARAS, MD as Consulting Physician (Oncology)  CHIEF COMPLAINT: Iron overload.  INTERVAL HISTORY: Patient returns to clinic today for repeat laboratory work and further evaluation.  She continues to feel well and remains asymptomatic.  She has no neurologic complaints.  She denies any recent fevers or illnesses.  She has a good appetite and denies weight loss.  She has no chest pain, shortness of breath, cough, or hemoptysis.  She denies any nausea, vomiting, constipation, or diarrhea.  She has no urinary complaints.  Patient feels at her baseline and offers no specific complaints today.  REVIEW OF SYSTEMS:   Review of Systems  Constitutional: Negative.  Negative for fever, malaise/fatigue and weight loss.  Respiratory: Negative.  Negative for cough, hemoptysis and shortness of breath.   Cardiovascular: Negative.  Negative for chest pain and leg swelling.  Gastrointestinal: Negative.  Negative for abdominal pain.  Genitourinary: Negative.  Negative for dysuria.  Musculoskeletal: Negative.  Negative for back pain.  Skin: Negative.  Negative for rash.  Neurological: Negative.  Negative for dizziness, focal weakness, weakness and headaches.  Psychiatric/Behavioral: Negative.  The patient is not nervous/anxious.    As per HPI. Otherwise, a complete review of systems is negative.   PAST MEDICAL HISTORY: Past Medical History:  Diagnosis Date   Arthritis    GERD (gastroesophageal reflux disease)     PAST SURGICAL HISTORY: Past Surgical History:  Procedure Laterality Date   BIOPSY  10/18/2023   Procedure: BIOPSY;  Surgeon: Therisa Bi, MD;  Location: [facility]  ENDOSCOPY;  Service: Gastroenterology;;   COLONOSCOPY WITH PROPOFOL  N/A 07/03/2019   Procedure: COLONOSCOPY WITH PROPOFOL ;  Surgeon: Therisa Bi, MD;  Location: [facility] ENDOSCOPY;  Service: Gastroenterology;  Laterality: N/A;   COLONOSCOPY WITH PROPOFOL  N/A 10/18/2023   Procedure: COLONOSCOPY WITH PROPOFOL ;  Surgeon: Therisa Bi, MD;  Location: [facility] ENDOSCOPY;  Service: Gastroenterology;  Laterality: N/A;   FEMUR FRACTURE SURGERY Left 2018   TUBAL LIGATION  1986    FAMILY HISTORY: Family History  Problem Relation Age of Onset   Arthritis Mother    COPD Father    Heart disease Father    Arthritis Sister    COPD Sister     ADVANCED DIRECTIVES (Y/N):  N  HEALTH MAINTENANCE: Social History   Tobacco Use   Smoking status: Every Day    Current packs/day: 0.50    Types: Cigarettes   Smokeless tobacco: Never   Tobacco comments:    10 cigarettes a day. Khj 04/15/2024  Vaping Use   Vaping status: Never Used  Substance Use Topics   Alcohol use: Yes    Alcohol/week: 14.0 standard drinks of alcohol    Types: 14 Cans of beer per week    Comment: 2 beers/day - occasionally   Drug use: Not Currently     Colonoscopy:  PAP:  Bone density:  Lipid panel:  Allergies  Allergen Reactions   Erythromycin Nausea And Vomiting    Current Outpatient Medications  Medication Sig Dispense Refill   albuterol  (VENTOLIN  HFA) 108 (90 Base) MCG/ACT inhaler Inhale 2 puffs into the lungs every 6 (six) hours as needed for wheezing or shortness of breath. 8 g 2   buPROPion (WELLBUTRIN SR) 150 MG 12  hr tablet      cetirizine (ZYRTEC) 10 MG tablet Take 10 mg by mouth daily.     cyclobenzaprine (FLEXERIL) 10 MG tablet Take 10 mg by mouth 2 (two) times daily as needed.     fluticasone  furoate-vilanterol (BREO ELLIPTA ) 200-25 MCG/ACT AEPB Inhale 1 puff into the lungs daily. 3 each 3   fluticasone  furoate-vilanterol (BREO ELLIPTA ) 200-25 MCG/ACT AEPB Inhale 1 puff into the lungs daily. 90 each 6   ibuprofen  (ADVIL) 600 MG tablet      naproxen (NAPROSYN) 500 MG tablet      naproxen sodium (ALEVE) 220 MG tablet Take by mouth.     omeprazole (PRILOSEC) 20 MG capsule Take 20 mg by mouth daily.     cholecalciferol (VITAMIN D3) 25 MCG (1000 UNIT) tablet Take 1,000 Units by mouth daily. (Patient not taking: Reported on 09/13/2024)     No current facility-administered medications for this visit.    OBJECTIVE: Vitals:   09/13/24 1106  BP: 138/80  Pulse: 88  Resp: 18  Temp: 98.5 F (36.9 C)  SpO2: 99%      Body mass index is 21.97 kg/m.    ECOG FS:0 - Asymptomatic  General: Well-developed, well-nourished, no acute distress. Eyes: Pink conjunctiva, anicteric sclera. HEENT: Normocephalic, moist mucous membranes. Lungs: No audible wheezing or coughing. Heart: Regular rate and rhythm. Abdomen: Soft, nontender, no obvious distention. Musculoskeletal: No edema, cyanosis, or clubbing. Neuro: Alert, answering all questions appropriately. Cranial nerves grossly intact. Skin: No rashes or petechiae noted. Psych: Normal affect.  LAB RESULTS:  Lab Results  Component Value Date   NA 137 08/07/2023   K 3.5 08/07/2023   CL 103 08/07/2023   CO2 23 08/07/2023   GLUCOSE 127 (H) 08/07/2023   BUN 11 08/07/2023   CREATININE 0.59 08/07/2023   CALCIUM 9.4 08/07/2023   PROT 7.3 06/12/2023   ALBUMIN 4.7 06/12/2023   AST 34 06/12/2023   ALKPHOS 116 06/12/2023   BILITOT 0.3 06/12/2023   GFRNONAA >60 08/07/2023    Lab Results  Component Value Date   WBC 3.8 (L) 09/12/2024   NEUTROABS 1.7 09/12/2024   HGB 15.3 (H) 09/12/2024   HCT 44.8 09/12/2024   MCV 92.8 09/12/2024   PLT 283 09/12/2024   Lab Results  Component Value Date   IRON 146 09/12/2024   TIBC 435 09/12/2024   IRONPCTSAT 34 (H) 09/12/2024   Lab Results  Component Value Date   FERRITIN 46 09/12/2024     STUDIES: No results found.  ASSESSMENT: Iron overload.  PLAN:    Iron overload: Patient has an elevated saturation  ratio of 34% which is unchanged from previous.  Hemoglobin is stable at 15.3.  The remainder of her iron panel and all of her other laboratory work including JAK2 mutation with reflex and hemochromatosis mutation was either negative or within normal limits.  No phlebotomy is needed at this time.  After discussion with the patient, was agreed upon that no further follow-up is necessary and recommended donating blood 2-3 times per year.  No further intervention is needed.  Please refer patient back if there are any questions or concerns.   I spent a total of 20 minutes reviewing chart data, face-to-face evaluation with the patient, counseling and coordination of care as detailed above.   Patient expressed understanding and was in agreement with this plan. She also understands that She can call clinic at any time with any questions, concerns, or complaints.    Kerrie Timm J Bergen Magner,  MD   09/13/2024 11:13 AM

## 2024-09-13 NOTE — Progress Notes (Signed)
No phlebotomy today.

## 2024-09-14 ENCOUNTER — Encounter: Payer: Self-pay | Admitting: Oncology

## 2024-09-26 ENCOUNTER — Other Ambulatory Visit: Payer: Self-pay | Admitting: Podiatry

## 2024-09-26 DIAGNOSIS — M216X2 Other acquired deformities of left foot: Secondary | ICD-10-CM | POA: Diagnosis not present

## 2024-09-26 DIAGNOSIS — D2372 Other benign neoplasm of skin of left lower limb, including hip: Secondary | ICD-10-CM | POA: Diagnosis not present

## 2024-09-26 MED ORDER — OXYCODONE-ACETAMINOPHEN 5-325 MG PO TABS: 1.0000 | ORAL_TABLET | ORAL | 0 refills | Status: AC | PRN

## 2024-09-26 MED ORDER — IBUPROFEN 800 MG PO TABS: 800.0000 mg | ORAL_TABLET | Freq: Three times a day (TID) | ORAL | 1 refills | Status: AC

## 2024-09-26 NOTE — Progress Notes (Signed)
 PRN postop

## 2024-10-04 ENCOUNTER — Ambulatory Visit: Admitting: Podiatry

## 2024-10-04 ENCOUNTER — Encounter: Payer: Self-pay | Admitting: Podiatry

## 2024-10-04 ENCOUNTER — Ambulatory Visit

## 2024-10-04 VITALS — Ht 65.0 in | Wt 132.0 lb

## 2024-10-04 DIAGNOSIS — D2372 Other benign neoplasm of skin of left lower limb, including hip: Secondary | ICD-10-CM | POA: Diagnosis not present

## 2024-10-04 DIAGNOSIS — M216X2 Other acquired deformities of left foot: Secondary | ICD-10-CM

## 2024-10-04 NOTE — Progress Notes (Unsigned)
   Chief Complaint  Patient presents with   Routine Post Op    POV #1 DOS 09/26/2024 LT 5TH METATARSAL OSTEOTOMY, pt is here to f/u on left foot due to recent surgery, she states everything is going well, has some pain but manageable.     Subjective:  Patient presents today status post fifth metatarsal floating osteotomy left.  DOS: 09/28/2024.  Doing well.  No new complaints  Past Medical History:  Diagnosis Date   Arthritis    GERD (gastroesophageal reflux disease)     Past Surgical History:  Procedure Laterality Date   BIOPSY  10/18/2023   Procedure: BIOPSY;  Surgeon: Therisa Bi, MD;  Location: [facility] ENDOSCOPY;  Service: Gastroenterology;;   COLONOSCOPY WITH PROPOFOL  N/A 07/03/2019   Procedure: COLONOSCOPY WITH PROPOFOL ;  Surgeon: Therisa Bi, MD;  Location: [facility] ENDOSCOPY;  Service: Gastroenterology;  Laterality: N/A;   COLONOSCOPY WITH PROPOFOL  N/A 10/18/2023   Procedure: COLONOSCOPY WITH PROPOFOL ;  Surgeon: Therisa Bi, MD;  Location: [facility] ENDOSCOPY;  Service: Gastroenterology;  Laterality: N/A;   FEMUR FRACTURE SURGERY Left 2018   TUBAL LIGATION  1986    Allergies  Allergen Reactions   Erythromycin Nausea And Vomiting    Objective/Physical Exam Neurovascular status intact.  Incision well coapted with sutures intact. No sign of infectious process noted. No dehiscence. No active bleeding noted.  No edema  Radiographic Exam LT foot 10/06/2024:  Floating osteotomy noted to the neck of the fifth metatarsal with the capital fragment is dorsally displaced location to alleviate pressure at the level of the MTP  Assessment: 1. s/p floating fifth metatarsal osteotomy left. DOS: 09/26/2024   Plan of Care:  -Patient was evaluated. X-rays reviewed - Okay to wash and shower and get the foot wet -Continue WBAT postop shoe -Return to clinic 10 days suture removal   Thresa EMERSON Sar, DPM [facility]  Dr. Thresa EMERSON Sar, DPM    [address] [place], [place] [address]                Office [phone]  Fax [phone]

## 2024-10-18 ENCOUNTER — Encounter: Admitting: Podiatry

## 2024-10-19 ENCOUNTER — Other Ambulatory Visit: Payer: Self-pay | Admitting: Pulmonary Disease

## 2024-10-19 DIAGNOSIS — J4489 Other specified chronic obstructive pulmonary disease: Secondary | ICD-10-CM

## 2024-10-22 ENCOUNTER — Ambulatory Visit: Admitting: Podiatry

## 2024-10-22 ENCOUNTER — Encounter: Payer: Self-pay | Admitting: Podiatry

## 2024-10-22 VITALS — Ht 65.0 in | Wt 132.0 lb

## 2024-10-22 DIAGNOSIS — M216X2 Other acquired deformities of left foot: Secondary | ICD-10-CM

## 2024-10-22 NOTE — Progress Notes (Signed)
   Chief Complaint  Patient presents with   Routine Post Op    POV #2 DOS 09/26/2024 LT 5TH METATARSAL OSTEOTOMY, she states everything is going well, continues to wear post op shoe, has minimal pain, no other complaints.     Subjective:  Patient presents today status post fifth metatarsal floating osteotomy left.  DOS: 09/28/2024.  Doing well.  No new complaints  Past Medical History:  Diagnosis Date   Arthritis    GERD (gastroesophageal reflux disease)     Past Surgical History:  Procedure Laterality Date   BIOPSY  10/18/2023   Procedure: BIOPSY;  Surgeon: Therisa Bi, MD;  Location: [facility] ENDOSCOPY;  Service: Gastroenterology;;   COLONOSCOPY WITH PROPOFOL  N/A 07/03/2019   Procedure: COLONOSCOPY WITH PROPOFOL ;  Surgeon: Therisa Bi, MD;  Location: [facility] ENDOSCOPY;  Service: Gastroenterology;  Laterality: N/A;   COLONOSCOPY WITH PROPOFOL  N/A 10/18/2023   Procedure: COLONOSCOPY WITH PROPOFOL ;  Surgeon: Therisa Bi, MD;  Location: [facility] ENDOSCOPY;  Service: Gastroenterology;  Laterality: N/A;   FEMUR FRACTURE SURGERY Left 2018   TUBAL LIGATION  1986    Allergies  Allergen Reactions   Erythromycin Nausea And Vomiting    Objective/Physical Exam Neurovascular status intact.  Incision well coapted with sutures intact. No sign of infectious process noted. No dehiscence. No active bleeding noted.  No edema  Radiographic Exam LT foot 10/06/2024:  Floating osteotomy noted to the neck of the fifth metatarsal with the capital fragment is dorsally displaced location to alleviate pressure at the level of the MTP  Assessment: 1. s/p floating fifth metatarsal osteotomy left. DOS: 09/26/2024   Plan of Care:  -Patient was evaluated.  -Sutures removed -Beginning in 1 week she can transition out of the postop shoe into good supportive tennis shoes and sneakers -Return to clinic 1 month follow-up x-ray  Thresa EMERSON Sar, DPM [facility]  Dr. Thresa EMERSON Sar, DPM    [address] [place], [place] [address]                Office [phone]  Fax [phone]

## 2024-11-27 ENCOUNTER — Telehealth: Payer: Self-pay | Admitting: Acute Care

## 2024-11-27 ENCOUNTER — Encounter: Payer: Self-pay | Admitting: Oncology

## 2024-11-27 DIAGNOSIS — Z122 Encounter for screening for malignant neoplasm of respiratory organs: Secondary | ICD-10-CM

## 2024-11-27 DIAGNOSIS — Z87891 Personal history of nicotine dependence: Secondary | ICD-10-CM

## 2024-11-27 DIAGNOSIS — F17210 Nicotine dependence, cigarettes, uncomplicated: Secondary | ICD-10-CM

## 2024-11-27 NOTE — Telephone Encounter (Signed)
 Lung Cancer Screening Narrative/Criteria Questionnaire (Cigarette Smokers Only- No Cigars/Pipes/vapes)   Cassidy Erickson   SDMV:12/04/2024 11:45 [name]        06-02-1961   LDCT: 12/05/2024 11:45 [facility]    63 y.o.   Phone: [phone]  Lung Screening Narrative (confirm age 71-77 yrs Medicare / 50-80 yrs Private pay insurance)   Insurance information:[employer]    Referring Provider:Lars Osa NP   This screening involves an initial phone call with a team member from our program. It is called a shared decision making visit. The initial meeting is required by  insurance and Medicare to make sure you understand the program. This appointment takes about 15-20 minutes to complete. You will complete the screening scan at your scheduled date/time.  This scan takes about 5-10 minutes to complete. You can eat and drink normally before and after the scan.  Criteria questions for Lung Cancer Screening:   Are you a current or former smoker? Current Age began smoking: 64yo   If you are a former smoker, what year did you quit smoking? Quit for a year then started back (within 15 yrs)   To calculate your smoking history, I need an accurate estimate of how many packs of cigarettes you smoked per day and for how many years. (Not just the number of PPD you are now smoking)   Years smoking 45 x Packs per day 3/4 = Pack years 33.75   (at least 20 pack yrs)   (Make sure they understand that we need to know how much they have smoked in the past, not just the number of PPD they are smoking now)  Do you have a personal history of cancer?  No    Do you have a family history of cancer? No  Are you coughing up blood?  No  Have you had unexplained weight loss of 15 lbs or more in the last 6 months? No  It looks like you meet all criteria.  When would be a good time for us  to schedule you for this screening?   Additional information: N/A

## 2024-11-29 ENCOUNTER — Other Ambulatory Visit: Payer: Self-pay

## 2024-11-29 DIAGNOSIS — Z1231 Encounter for screening mammogram for malignant neoplasm of breast: Secondary | ICD-10-CM

## 2024-12-04 ENCOUNTER — Ambulatory Visit (INDEPENDENT_AMBULATORY_CARE_PROVIDER_SITE_OTHER): Admitting: Adult Health

## 2024-12-04 ENCOUNTER — Encounter: Payer: Self-pay | Admitting: Adult Health

## 2024-12-04 DIAGNOSIS — F17210 Nicotine dependence, cigarettes, uncomplicated: Secondary | ICD-10-CM | POA: Diagnosis not present

## 2024-12-04 NOTE — Progress Notes (Signed)
" °  Virtual Visit via Telephone Note  I connected with Cassidy Erickson , 12/04/24 11:04 AM by a telemedicine application and verified that I am speaking with the correct person using two identifiers.  Location: Patient: home Provider: home   I discussed the limitations of evaluation and management by telemedicine and the availability of in person appointments. The patient expressed understanding and agreed to proceed.   Shared Decision Making Visit Lung Cancer Screening Program [phone])   Eligibility: 64 y.o. Pack Years Smoking History Calculation = 34 pack years (# packs/per year x # years smoked) Recent History of coughing up blood  no Unexplained weight loss? no ( >Than 15 pounds within the last 6 months ) Prior History Lung / other cancer no (Diagnosis within the last 5 years already requiring surveillance chest CT Scans). Smoking Status Current Smoker   Visit Components: Discussion included one or more decision making aids. YES Discussion included risk/benefits of screening. YES Discussion included potential follow up diagnostic testing for abnormal scans. YES Discussion included meaning and risk of over diagnosis. YES Discussion included meaning and risk of False Positives. YES Discussion included meaning of total radiation exposure. YES  Counseling Included: Importance of adherence to annual lung cancer LDCT screening. YES Impact of comorbidities on ability to participate in the program. YES Ability and willingness to under diagnostic treatment. YES  Smoking Cessation Counseling: Current Smokers:  Discussed importance of smoking cessation. yes Information about tobacco cessation classes and interventions provided to patient. yes Patient provided with ticket for LDCT Scan. yes Symptomatic Patient. NO Diagnosis Code: Tobacco Use Z72.0 Asymptomatic Patient yes  Counseling - 4 minutes of smoking cessation counseling  (CT Chest Lung Cancer Screening Low Dose W/O CM)  [id number]  Smoking/Tobacco Cessation Counseling Blakley Michna All is a current user of tobacco or nicotine products. She is considering quitting at this time. Counseling provided today addressed the risks of continued use and the benefits of cessation. Discussed tobacco/nicotine use history, readiness to quit, and evidence-based treatment options including behavioral strategies, support resources, and pharmacologic therapies. Provided encouragement and educational materials on steps and resources to quit smoking. Patient questions were addressed, and follow-up recommended for continued support. Total time spent on counseling: 3 minutes.    Z12.2-Screening of respiratory organs Z87.891-Personal history of nicotine dependence   Cassidy Erickson 12/04/24     "

## 2024-12-04 NOTE — Patient Instructions (Signed)

## 2024-12-05 ENCOUNTER — Ambulatory Visit
Admission: RE | Admit: 2024-12-05 | Discharge: 2024-12-05 | Disposition: A | Discharge status: Home / Self Care | Source: Ambulatory Visit | Attending: Acute Care | Admitting: Acute Care

## 2024-12-05 DIAGNOSIS — F17210 Nicotine dependence, cigarettes, uncomplicated: Secondary | ICD-10-CM | POA: Diagnosis present

## 2024-12-05 DIAGNOSIS — J439 Emphysema, unspecified: Secondary | ICD-10-CM | POA: Insufficient documentation

## 2024-12-05 DIAGNOSIS — Z122 Encounter for screening for malignant neoplasm of respiratory organs: Secondary | ICD-10-CM | POA: Diagnosis present

## 2024-12-05 DIAGNOSIS — Z87891 Personal history of nicotine dependence: Secondary | ICD-10-CM | POA: Diagnosis present

## 2024-12-10 ENCOUNTER — Other Ambulatory Visit: Payer: Self-pay

## 2024-12-10 DIAGNOSIS — Z122 Encounter for screening for malignant neoplasm of respiratory organs: Secondary | ICD-10-CM

## 2024-12-10 DIAGNOSIS — F17210 Nicotine dependence, cigarettes, uncomplicated: Secondary | ICD-10-CM

## 2024-12-10 DIAGNOSIS — Z87891 Personal history of nicotine dependence: Secondary | ICD-10-CM

## 2024-12-19 ENCOUNTER — Other Ambulatory Visit: Payer: Self-pay | Admitting: Pulmonary Disease

## 2024-12-19 DIAGNOSIS — J441 Chronic obstructive pulmonary disease with (acute) exacerbation: Secondary | ICD-10-CM

## 2024-12-19 DIAGNOSIS — J439 Emphysema, unspecified: Secondary | ICD-10-CM

## 2024-12-24 ENCOUNTER — Encounter

## 2025-04-02 ENCOUNTER — Ambulatory Visit: Admitting: Family Medicine
